# Patient Record
Sex: Male | Born: 1990 | Race: Black or African American | Hispanic: No | Marital: Single
Health system: Midwestern US, Community
[De-identification: ages and names within clinical notes are randomized; demographics above are authoritative.]

## PROBLEM LIST (undated history)

## (undated) DIAGNOSIS — F319 Bipolar disorder, unspecified: Secondary | ICD-10-CM

## (undated) DIAGNOSIS — J45909 Unspecified asthma, uncomplicated: Secondary | ICD-10-CM

## (undated) DIAGNOSIS — E119 Type 2 diabetes mellitus without complications: Secondary | ICD-10-CM

## (undated) DIAGNOSIS — R45850 Homicidal ideations: Secondary | ICD-10-CM

## (undated) HISTORY — PX: KNEE ARTHROSCOPY: SHX127

## (undated) HISTORY — PX: HAND SURGERY: SHX662

---

## 2015-05-03 ENCOUNTER — Emergency Department (HOSPITAL_COMMUNITY): Payer: Self-pay

## 2015-05-03 ENCOUNTER — Emergency Department (HOSPITAL_BASED_OUTPATIENT_CLINIC_OR_DEPARTMENT_OTHER): Payer: Self-pay

## 2015-05-03 ENCOUNTER — Encounter (HOSPITAL_COMMUNITY): Payer: Self-pay | Admitting: Emergency Medicine

## 2015-05-03 ENCOUNTER — Emergency Department (HOSPITAL_BASED_OUTPATIENT_CLINIC_OR_DEPARTMENT_OTHER)
Admission: EM | Admit: 2015-05-03 | Discharge: 2015-05-03 | Discharge status: Against Medical Advice | Payer: Self-pay | Attending: Emergency Medicine | Admitting: Emergency Medicine

## 2015-05-03 ENCOUNTER — Encounter (HOSPITAL_BASED_OUTPATIENT_CLINIC_OR_DEPARTMENT_OTHER): Payer: Self-pay | Admitting: Emergency Medicine

## 2015-05-03 DIAGNOSIS — R0602 Shortness of breath: Secondary | ICD-10-CM | POA: Insufficient documentation

## 2015-05-03 DIAGNOSIS — R05 Cough: Secondary | ICD-10-CM | POA: Insufficient documentation

## 2015-05-03 DIAGNOSIS — J45901 Unspecified asthma with (acute) exacerbation: Secondary | ICD-10-CM | POA: Insufficient documentation

## 2015-05-03 DIAGNOSIS — Z720 Tobacco use: Secondary | ICD-10-CM | POA: Insufficient documentation

## 2015-05-03 DIAGNOSIS — R079 Chest pain, unspecified: Secondary | ICD-10-CM | POA: Insufficient documentation

## 2015-05-03 DIAGNOSIS — J45909 Unspecified asthma, uncomplicated: Secondary | ICD-10-CM | POA: Insufficient documentation

## 2015-05-03 LAB — CBC
HCT: 38.6 % — ABNORMAL LOW (ref 39.0–52.0)
HEMOGLOBIN: 13.1 g/dL (ref 13.0–17.0)
MCH: 29 pg (ref 26.0–34.0)
MCHC: 33.9 g/dL (ref 30.0–36.0)
MCV: 85.4 fL (ref 78.0–100.0)
Platelets: 383 10*3/uL (ref 150–400)
RBC: 4.52 MIL/uL (ref 4.22–5.81)
RDW: 14.7 % (ref 11.5–15.5)
WBC: 5.8 10*3/uL (ref 4.0–10.5)

## 2015-05-03 LAB — I-STAT TROPONIN, ED: Troponin i, poc: 0 ng/mL (ref 0.00–0.08)

## 2015-05-03 LAB — TROPONIN I: Troponin I: <0.03 ng/mL (ref <0.031)

## 2015-05-03 NOTE — ED Notes (Signed)
Pt requesting IV out as he states he needs to leave.  Pt states, "I didn't think it would take this long and I need to get home."  Pt verbalizes understanding of the risks of leaving and the benefits of staying.

## 2015-05-03 NOTE — ED Notes (Signed)
Pt. reports central chest pain with SOB and productive cough onset last week , denies nausea or diaphoresis .

## 2015-05-03 NOTE — ED Notes (Signed)
Patient reports that he is having SOB, chest pain and hurts to cough.

## 2015-05-04 ENCOUNTER — Emergency Department (HOSPITAL_COMMUNITY)
Admission: EM | Admit: 2015-05-04 | Discharge: 2015-05-04 | Discharge status: Against Medical Advice | Payer: Self-pay | Attending: Emergency Medicine | Admitting: Emergency Medicine

## 2015-05-04 HISTORY — DX: Bipolar disorder, unspecified: F31.9

## 2015-05-04 HISTORY — DX: Unspecified asthma, uncomplicated: J45.909

## 2015-05-04 LAB — BASIC METABOLIC PANEL
Anion gap: 8 (ref 5–15)
BUN: 8 mg/dL (ref 6–20)
CALCIUM: 9.5 mg/dL (ref 8.9–10.3)
CO2: 25 mmol/L (ref 22–32)
CREATININE: 0.88 mg/dL (ref 0.61–1.24)
Chloride: 107 mmol/L (ref 101–111)
Glucose, Bld: 116 mg/dL — ABNORMAL HIGH (ref 65–99)
Potassium: 3.8 mmol/L (ref 3.5–5.1)
SODIUM: 140 mmol/L (ref 135–145)

## 2015-05-04 NOTE — ED Notes (Signed)
Patient wanting to leave.  Encouraged patient to stay to be seen.  Spoke with Dr. Mora Bellman and explained that the patient wanted to go.  He looked at the EKG and explained to him that it is similar to the EKG done yesterday at [facility].  Talked with the patient and the patient decided he was going to leave.  Talked to him explaining he needed to come back if he began feeling worse.

## 2015-05-15 ENCOUNTER — Encounter (HOSPITAL_COMMUNITY): Payer: Self-pay

## 2015-05-15 ENCOUNTER — Emergency Department (HOSPITAL_COMMUNITY)
Admission: EM | Admit: 2015-05-15 | Discharge: 2015-05-16 | Disposition: A | Discharge status: Other Acute Inpatient Hospital | Payer: Self-pay | Attending: Emergency Medicine | Admitting: Emergency Medicine

## 2015-05-15 DIAGNOSIS — J45909 Unspecified asthma, uncomplicated: Secondary | ICD-10-CM | POA: Insufficient documentation

## 2015-05-15 DIAGNOSIS — F329 Major depressive disorder, single episode, unspecified: Secondary | ICD-10-CM | POA: Insufficient documentation

## 2015-05-15 DIAGNOSIS — Z720 Tobacco use: Secondary | ICD-10-CM | POA: Insufficient documentation

## 2015-05-15 DIAGNOSIS — F3130 Bipolar disorder, current episode depressed, mild or moderate severity, unspecified: Secondary | ICD-10-CM | POA: Diagnosis present

## 2015-05-15 DIAGNOSIS — R45851 Suicidal ideations: Secondary | ICD-10-CM

## 2015-05-15 LAB — COMPREHENSIVE METABOLIC PANEL
ALT: 23 U/L (ref 17–63)
ANION GAP: 10 (ref 5–15)
AST: 22 U/L (ref 15–41)
Albumin: 4 g/dL (ref 3.5–5.0)
Alkaline Phosphatase: 73 U/L (ref 38–126)
BUN: 12 mg/dL (ref 6–20)
CHLORIDE: 108 mmol/L (ref 101–111)
CO2: 22 mmol/L (ref 22–32)
CREATININE: 0.78 mg/dL (ref 0.61–1.24)
Calcium: 9.6 mg/dL (ref 8.9–10.3)
GFR calc non Af Amer: >60 mL/min (ref >60)
Glucose, Bld: 92 mg/dL (ref 65–99)
POTASSIUM: 3.8 mmol/L (ref 3.5–5.1)
SODIUM: 140 mmol/L (ref 135–145)
Total Bilirubin: 0.6 mg/dL (ref 0.3–1.2)
Total Protein: 7.9 g/dL (ref 6.5–8.1)

## 2015-05-15 LAB — SALICYLATE LEVEL

## 2015-05-15 LAB — CBC
HCT: 39.8 % (ref 39.0–52.0)
HEMOGLOBIN: 13.3 g/dL (ref 13.0–17.0)
MCH: 28.7 pg (ref 26.0–34.0)
MCHC: 33.4 g/dL (ref 30.0–36.0)
MCV: 86 fL (ref 78.0–100.0)
PLATELETS: 391 10*3/uL (ref 150–400)
RBC: 4.63 MIL/uL (ref 4.22–5.81)
RDW: 15.1 % (ref 11.5–15.5)
WBC: 5.4 10*3/uL (ref 4.0–10.5)

## 2015-05-15 LAB — RAPID URINE DRUG SCREEN, HOSP PERFORMED
AMPHETAMINES: NOT DETECTED
Barbiturates: NOT DETECTED
Benzodiazepines: NOT DETECTED
COCAINE: NOT DETECTED
OPIATES: NOT DETECTED
TETRAHYDROCANNABINOL: NOT DETECTED

## 2015-05-15 LAB — ETHANOL

## 2015-05-15 LAB — ACETAMINOPHEN LEVEL

## 2015-05-15 MED ORDER — ONDANSETRON HCL 4 MG PO TABS: 4.0000 mg | ORAL_TABLET | Freq: Three times a day (TID) | ORAL | Status: DC | PRN

## 2015-05-15 MED ORDER — IBUPROFEN 200 MG PO TABS: 600.0000 mg | ORAL_TABLET | Freq: Three times a day (TID) | ORAL | Status: DC | PRN

## 2015-05-15 MED ORDER — ALUM & MAG HYDROXIDE-SIMETH 200-200-20 MG/5ML PO SUSP: 30.0000 mL | ORAL | Status: DC | PRN

## 2015-05-15 MED ORDER — LORAZEPAM 1 MG PO TABS: 1.0000 mg | ORAL_TABLET | Freq: Three times a day (TID) | ORAL | Status: DC | PRN

## 2015-05-15 MED ORDER — ACETAMINOPHEN 325 MG PO TABS: 650.0000 mg | ORAL_TABLET | ORAL | Status: DC | PRN

## 2015-05-15 MED ORDER — NICOTINE 21 MG/24HR TD PT24
21.0000 mg | MEDICATED_PATCH | Freq: Every day | TRANSDERMAL | Status: DC
  Filled 2015-05-15: qty 1

## 2015-05-15 NOTE — ED Provider Notes (Signed)
CSN: [id number]     Arrival date & time 05/15/15  1216 History  This chart was scribed for non-physician practitioner, Everlene Farrier, PA-C working with Pricilla Loveless, MD by Placido Sou, ED scribe. This patient was seen in room WTR3/WLPT3 and the patient's care was started at 1:51 PM.   Chief Complaint  Patient presents with  . Suicidal   The history is provided by the patient. No language interpreter was used.    HPI Comments: Blake Petersen is a 24 y.o. male, with a hx of bipolar disorder, ADHD and depression, who presents to the Emergency Department with GPD complaining of SI with onset earlier today. Pt notes a hx of depression and has been off of his medications for 1 month. He notes some current SI and says that he would "run out in traffic" as well as HI noting that he wants to "rip his bosses head off" and some mild sleep disturbance further noting sleeping about 3 hours per night. Pt notes a hx of SI with multiple attempts to cut his wrists. He notes a hx of mental health issues and further notes he was last admitted for these symptoms 2 years ago. He notes smoking 1 PPD, confirms intermittent ETOH use with his most recent occurrence 1 week ago and further notes 1x ecstasy, acid and ingesting mushrooms 1 week ago. Pt denies hallucinations, fevers, chills, lightheadedness, CP, SOB, n/v/d and dysuria.   Past Medical History  Diagnosis Date  . Bipolar 1 disorder   . Asthma    Past Surgical History  Procedure Laterality Date  . Hand surgery    . Knee arthroscopy     History reviewed. No pertinent family history. Social History  Substance Use Topics  . Smoking status: Current Every Day Smoker  . Smokeless tobacco: None  . Alcohol Use: Yes     Comment: occ    Review of Systems  Constitutional: Negative for fever and chills.  Respiratory: Negative for shortness of breath.   Cardiovascular: Negative for chest pain.  Gastrointestinal: Negative for nausea, vomiting, abdominal pain  and diarrhea.  Genitourinary: Negative for dysuria.  Skin: Negative for rash.  Neurological: Negative for light-headedness.  Psychiatric/Behavioral: Positive for suicidal ideas, sleep disturbance and dysphoric mood. Negative for hallucinations, confusion and self-injury.    Allergies  Iodine and Shellfish allergy  Home Medications   Prior to Admission medications   Not on File   BP 122/73 mmHg  Pulse 71  Temp(Src) 97.9 F (36.6 C) (Oral)  Resp 17  SpO2 99% Physical Exam  Constitutional: He is oriented to person, place, and time. He appears well-developed and well-nourished. No distress.  Nontoxic appearing.   HENT:  Head: Normocephalic and atraumatic.  Eyes: Conjunctivae are normal. Pupils are equal, round, and reactive to light. Right eye exhibits no discharge. Left eye exhibits no discharge.  Neck: Neck supple.  Cardiovascular: Normal rate, regular rhythm, normal heart sounds and intact distal pulses.   Pulmonary/Chest: Effort normal and breath sounds normal. No respiratory distress.  Abdominal: Soft. There is no tenderness.  Lymphadenopathy:    He has no cervical adenopathy.  Neurological: He is alert and oriented to person, place, and time. Coordination normal.  Skin: Skin is warm and dry. No rash noted. He is not diaphoretic. No erythema. No pallor.  Psychiatric: His speech is normal and behavior is normal. He is not hyperactive and not actively hallucinating. Thought content is not paranoid. He exhibits a depressed mood. He expresses homicidal and suicidal ideation. He  expresses suicidal plans. He expresses no homicidal plans.  Patient endorses suicidal ideations with a plan to run into traffic. He also reports feeling homicidal ideations earlier today. Patient makes good eye contact. He appears depressed. His speech is clear and coherent. He denies visual or auditory hallucinations.  Nursing note and vitals reviewed.  ED Course  Procedures  DIAGNOSTIC STUDIES: Oxygen  Saturation is 97% on RA, normal by my interpretation.    COORDINATION OF CARE: 1:58 PM Discussed treatment plan with pt at bedside and pt agreed to plan.  Labs Review Labs Reviewed  ACETAMINOPHEN LEVEL - Abnormal; Notable for the following:    Acetaminophen (Tylenol), Serum <10 (*)    All other components within normal limits  COMPREHENSIVE METABOLIC PANEL  ETHANOL  SALICYLATE LEVEL  CBC  URINE RAPID DRUG SCREEN, HOSP PERFORMED    Imaging Review No results found. I have personally reviewed and evaluated these images and lab results as part of my medical decision-making.   EKG Interpretation None      Filed Vitals:   05/15/15 1226 05/15/15 1419  BP: 133/86 122/73  Pulse: 87 71  Temp: 98.3 F (36.8 C) 97.9 F (36.6 C)  TempSrc: Oral Oral  Resp: 18 17  SpO2: 97% 99%     MDM   Meds given in ED:  Medications  alum & mag hydroxide-simeth (MAALOX/MYLANTA) 200-200-20 MG/5ML suspension 30 mL (not administered)  ondansetron (ZOFRAN) tablet 4 mg (not administered)  nicotine (NICODERM CQ - dosed in mg/24 hours) patch 21 mg (not administered)  ibuprofen (ADVIL,MOTRIN) tablet 600 mg (not administered)  acetaminophen (TYLENOL) tablet 650 mg (not administered)  LORazepam (ATIVAN) tablet 1 mg (not administered)    New Prescriptions   No medications on file    Final diagnoses:  Suicidal ideation   This is a 24 year old male who presents to the emergency department complaining of suicidal ideations with a plan to jump into traffic today. Patient reports she's been off of his medications for over a month. The patient is voluntary. He denies fevers or recent illness. He denies visual or auditory hallucinations. Patient appears depressed and makes good eye contact. He denies any attempts of suicide today. CMP and CBC are within normal limits. Alcohol, salicylate and acetaminophen levels are negative. Patient's urine drug screen is pending. Patient is medically clear for behavioral  health admission. TTS feels the patient meets inpatient criteria and they're awaiting bed assignment. Psych hold orders placed.   I personally performed the services described in this documentation, which was scribed in my presence. The recorded information has been reviewed and is accurate.    Everlene Farrier, PA-C 05/15/15 1601  Pricilla Loveless, MD 05/16/15 [phone]

## 2015-05-15 NOTE — ED Notes (Signed)
Patient ambulated onto the unit without difficulty.  Has been lying on his bed since arriving on the unit.

## 2015-05-15 NOTE — ED Notes (Signed)
Per Pt, here with GPD voluntarily for Suicidal Ideation.  Pt states it has been going on for a while.  Pt states he has been off of his meds x 2 months.  Pt states he would run into traffic.  Denies attempt for today.

## 2015-05-15 NOTE — BH Assessment (Signed)
Per Blake Petersen - patient meets criteria for inpatient hospitalization.  TTS will seek placement.

## 2015-05-15 NOTE — Progress Notes (Signed)
EDCM spoke to patient at bedside. Patient confirms he does not have a pcp or insurance living in [place].  [facility] provided patient contact information to [facility], informed patient of services there.  EDCM also provided patient with list of pcps who accept self pay patients, list of discount pharmacies and websites needymeds.org and GoodRX.com for medication assistance, phone number to inquire about the orange card, phone number to inquire about Mediciad, phone number to inquire about the Affordable Care Act, financial resources in the community such as local churches, salvation army, urban ministries, and dental assistance for uninsured patients.  Patient thankful for resources.  No further EDCM needs at this time.

## 2015-05-15 NOTE — ED Notes (Signed)
Bed: [facility]PT3 Expected date:  Expected time:  Means of arrival:  Comments: Med Clearance GPD
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
## 2015-05-15 NOTE — ED Notes (Signed)
Bed: WBH40 Expected date:  Expected time:  Means of arrival:  Comments: Triage 3 

## 2015-05-16 ENCOUNTER — Inpatient Hospital Stay (HOSPITAL_COMMUNITY)
Admission: AD | Admit: 2015-05-16 | Discharge: 2015-05-24 | DRG: 885 | Disposition: A | Discharge status: Home / Self Care | Payer: Federal, State, Local not specified - Other | Source: Intra-hospital | Attending: Psychiatry | Admitting: Psychiatry

## 2015-05-16 ENCOUNTER — Encounter (HOSPITAL_COMMUNITY): Payer: Self-pay

## 2015-05-16 DIAGNOSIS — R45851 Suicidal ideations: Secondary | ICD-10-CM | POA: Diagnosis present

## 2015-05-16 DIAGNOSIS — F3130 Bipolar disorder, current episode depressed, mild or moderate severity, unspecified: Secondary | ICD-10-CM | POA: Diagnosis present

## 2015-05-16 DIAGNOSIS — E781 Pure hyperglyceridemia: Secondary | ICD-10-CM | POA: Diagnosis present

## 2015-05-16 DIAGNOSIS — F319 Bipolar disorder, unspecified: Principal | ICD-10-CM | POA: Diagnosis present

## 2015-05-16 DIAGNOSIS — Z23 Encounter for immunization: Secondary | ICD-10-CM | POA: Diagnosis not present

## 2015-05-16 DIAGNOSIS — F17210 Nicotine dependence, cigarettes, uncomplicated: Secondary | ICD-10-CM | POA: Diagnosis present

## 2015-05-16 DIAGNOSIS — G4700 Insomnia, unspecified: Secondary | ICD-10-CM | POA: Diagnosis present

## 2015-05-16 DIAGNOSIS — F314 Bipolar disorder, current episode depressed, severe, without psychotic features: Secondary | ICD-10-CM

## 2015-05-16 MED ORDER — ALUM & MAG HYDROXIDE-SIMETH 200-200-20 MG/5ML PO SUSP: 30.0000 mL | ORAL | Status: DC | PRN

## 2015-05-16 MED ORDER — FLUOXETINE HCL 10 MG PO CAPS
10.0000 mg | ORAL_CAPSULE | Freq: Every day | ORAL | Status: DC
  Administered 2015-05-17 – 2015-05-18 (×2): 10 mg via ORAL
  Filled 2015-05-16 (×5): qty 1

## 2015-05-16 MED ORDER — NICOTINE 21 MG/24HR TD PT24
21.0000 mg | MEDICATED_PATCH | Freq: Every day | TRANSDERMAL | Status: DC
  Administered 2015-05-17 – 2015-05-24 (×9): 21 mg via TRANSDERMAL
  Filled 2015-05-16 (×15): qty 1

## 2015-05-16 MED ORDER — IBUPROFEN 600 MG PO TABS: 600.0000 mg | ORAL_TABLET | Freq: Three times a day (TID) | ORAL | Status: DC | PRN

## 2015-05-16 MED ORDER — FLUOXETINE HCL 10 MG PO CAPS
10.0000 mg | ORAL_CAPSULE | Freq: Every day | ORAL | Status: DC
  Administered 2015-05-16: 10 mg via ORAL
  Filled 2015-05-16: qty 1

## 2015-05-16 MED ORDER — CARBAMAZEPINE 200 MG PO TABS
200.0000 mg | ORAL_TABLET | Freq: Two times a day (BID) | ORAL | Status: DC
  Administered 2015-05-16 – 2015-05-18 (×4): 200 mg via ORAL
  Filled 2015-05-16 (×10): qty 1

## 2015-05-16 MED ORDER — ACETAMINOPHEN 325 MG PO TABS: 650.0000 mg | ORAL_TABLET | ORAL | Status: DC | PRN

## 2015-05-16 MED ORDER — CARBAMAZEPINE 200 MG PO TABS
200.0000 mg | ORAL_TABLET | Freq: Two times a day (BID) | ORAL | Status: DC
  Filled 2015-05-16 (×2): qty 1

## 2015-05-16 NOTE — B[facility] Counselor (Signed)
Referral packet sent to [facility], [place], [place], and [place] on 05/16/15 at 1:45 a.m.  Shean K. Harris, MS, NCC, LCAS-A  Counselor 05/16/2015 2:20 AM

## 2015-05-16 NOTE — Consult Note (Signed)
[facility] Consult   Reason for Consult:  Bipolar disorder currently  Depressed Referring Physician:  EDP Patient Identification: Blake Petersen MRN:  [id number] Principal Diagnosis: Bipolar affective disorder, current episode depressed Diagnosis:   Patient Active Problem List   Diagnosis Date Noted  . Bipolar affective disorder, current episode depressed [F31.30] 05/16/2015    Priority: High    Total Time spent with patient: 1 hour  Subjective:   Blake Petersen is a 24 y.o. male patient admitted with [facility] .  HPI:  AA male, 24 years old was evaluated for suicide ideation brought in by [employer].  Patient reports that he has been off his Bipolar disorder medication for 3 months.  Patient also stated that he started feeling more depressed and wanted to kill himself when he stopped taking medications.  Patient relocated from [place] and stated that his Psychiatrist  is in [place].   Patient reports poor sleep 3-4 hours at night.  He also has been homeless  For few days.  He was staying at a Motel but had to leave because he could not afford to pay for the hotel.  He is still endorsing suicide with a plan to jump in front of traffic but denies HI/AVH.   He admitted to previous suicide attempt by cutting his wrist.   He has been accepted for admission and has a bed assigned.  HPI Elements:   Location:  Schizoaffective disorder, depressed. Quality:  severe. Severity:  severe. Timing:  acute. Duration:  Chronic mental illness.. Context:  Brought in for suicide ideation..  Past Medical History:  Past Medical History  Diagnosis Date  . Bipolar 1 disorder   . Asthma     Past Surgical History  Procedure Laterality Date  . Hand surgery    . Knee arthroscopy     Family History: History reviewed. No pertinent family history. Social History:  History  Alcohol Use  . Yes    Comment: occ     History  Drug Use No    Social History   Social History  . Marital  Status: Single    Spouse Name: N/A  . Number of Children: N/A  . Years of Education: N/A   Social History Main Topics  . Smoking status: Current Every Day Smoker  . Smokeless tobacco: None  . Alcohol Use: Yes     Comment: occ  . Drug Use: No  . Sexual Activity: Not Asked   Other Topics Concern  . None   Social History Narrative   Additional Social History:    History of alcohol / drug use?: No history of alcohol / drug abuse                     Allergies:   Allergies  Allergen Reactions  . Iodine Anaphylaxis  . Shellfish Allergy Anaphylaxis    Labs:  Results for orders placed or performed during the hospital encounter of 05/15/15 (from the past 48 hour(s))  Ethanol (ETOH)     Status: None   Collection Time: 05/15/15 12:53 PM  Result Value Ref Range   Alcohol, Ethyl (B) <5 <5 mg/dL    Comment:        LOWEST DETECTABLE LIMIT FOR SERUM ALCOHOL IS 5 mg/dL FOR MEDICAL PURPOSES ONLY   Salicylate level     Status: None   Collection Time: 05/15/15 12:53 PM  Result Value Ref Range   Salicylate Lvl <1.7 2.8 - 30.0 mg/dL  Acetaminophen level  Status: Abnormal   Collection Time: 05/15/15 12:53 PM  Result Value Ref Range   Acetaminophen (Tylenol), Serum <10 (L) 10 - 30 ug/mL    Comment:        THERAPEUTIC CONCENTRATIONS VARY SIGNIFICANTLY. A RANGE OF 10-30 ug/mL MAY BE AN EFFECTIVE CONCENTRATION FOR MANY PATIENTS. HOWEVER, SOME ARE BEST TREATED AT CONCENTRATIONS OUTSIDE THIS RANGE. ACETAMINOPHEN CONCENTRATIONS >150 ug/mL AT 4 HOURS AFTER INGESTION AND >50 ug/mL AT 12 HOURS AFTER INGESTION ARE OFTEN ASSOCIATED WITH TOXIC REACTIONS.   Comprehensive metabolic panel     Status: None   Collection Time: 05/15/15 12:58 PM  Result Value Ref Range   Sodium 140 135 - 145 mmol/L   Potassium 3.8 3.5 - 5.1 mmol/L   Chloride 108 101 - 111 mmol/L   CO2 22 22 - 32 mmol/L   Glucose, Bld 92 65 - 99 mg/dL   BUN 12 6 - 20 mg/dL   Creatinine, Ser 0.78 0.61 - 1.24  mg/dL   Calcium 9.6 8.9 - 10.3 mg/dL   Total Protein 7.9 6.5 - 8.1 g/dL   Albumin 4.0 3.5 - 5.0 g/dL   AST 22 15 - 41 U/L   ALT 23 17 - 63 U/L   Alkaline Phosphatase 73 38 - 126 U/L   Total Bilirubin 0.6 0.3 - 1.2 mg/dL   GFR calc non Af Amer >60 >60 mL/min   GFR calc Af Amer >60 >60 mL/min    Comment: (NOTE) The eGFR has been calculated using the CKD EPI equation. This calculation has not been validated in all clinical situations. eGFR's persistently <60 mL/min signify possible Chronic Kidney Disease.    Anion gap 10 5 - 15  CBC     Status: None   Collection Time: 05/15/15 12:58 PM  Result Value Ref Range   WBC 5.4 4.0 - 10.5 K/uL   RBC 4.63 4.22 - 5.81 MIL/uL   Hemoglobin 13.3 13.0 - 17.0 g/dL   HCT 39.8 39.0 - 52.0 %   MCV 86.0 78.0 - 100.0 fL   MCH 28.7 26.0 - 34.0 pg   MCHC 33.4 30.0 - 36.0 g/dL   RDW 15.1 11.5 - 15.5 %   Platelets 391 150 - 400 K/uL  Urine rapid drug screen (hosp performed) (Not at [facility])     Status: None   Collection Time: 05/15/15  6:49 PM  Result Value Ref Range   Opiates NONE DETECTED NONE DETECTED   Cocaine NONE DETECTED NONE DETECTED   Benzodiazepines NONE DETECTED NONE DETECTED   Amphetamines NONE DETECTED NONE DETECTED   Tetrahydrocannabinol NONE DETECTED NONE DETECTED   Barbiturates NONE DETECTED NONE DETECTED    Comment:        DRUG SCREEN FOR MEDICAL PURPOSES ONLY.  IF CONFIRMATION IS NEEDED FOR ANY PURPOSE, NOTIFY LAB WITHIN 5 DAYS.        LOWEST DETECTABLE LIMITS FOR URINE DRUG SCREEN Drug Class       Cutoff (ng/mL) Amphetamine      1000 Barbiturate      200 Benzodiazepine   592 Tricyclics       924 Opiates          300 Cocaine          300 THC              50     Vitals: Blood pressure 118/62, pulse 73, temperature 98.2 F (36.8 C), temperature source Oral, resp. rate 18, SpO2 98 %.  Risk to Self: Suicidal Ideation: Yes-Currently  Present Suicidal Intent: Yes-Currently Present Is patient at risk for suicide?:  Yes Suicidal Plan?: Yes-Currently Present Specify Current Suicidal Plan: Run into traffic or cut his wrist Access to Means: Yes Specify Access to Suicidal Means: Running into traffic or a piece of glass. What has been your use of drugs/alcohol within the last 12 months?: None Reported How many times?: 1 Other Self Harm Risks: None Reported Triggers for Past Attempts: Unpredictable Intentional Self Injurious Behavior: Cutting Comment - Self Injurious Behavior: wrist Risk to Others: Homicidal Ideation: No Thoughts of Harm to Others: No Current Homicidal Intent: No Current Homicidal Plan: No Access to Homicidal Means: No Identified Victim: None Reported History of harm to others?: No Assessment of Violence: None Noted Violent Behavior Description: None Reported Does patient have access to weapons?: No Criminal Charges Pending?: No Does patient have a court date: No Prior Inpatient Therapy: Prior Inpatient Therapy: Yes Prior Therapy Dates: 2014 Prior Therapy Facilty/Provider(s): [facility] in [place] to remember the name. Reason for Treatment: SI Prior Outpatient Therapy: Prior Outpatient Therapy: Yes Prior Therapy Dates: July 2016 Prior Therapy Facilty/Provider(s): [facility] Reason for Treatment: Medication Management and Outpatient Therapy  Does patient have an ACCT team?: No Does patient have Intensive In-House Services?  : No Does patient have [facility] services? : No Does patient have P4CC services?: No  Current Facility-Administered Medications  Medication Dose Route Frequency Provider Last Rate Last Dose  . acetaminophen (TYLENOL) tablet 650 mg  650 mg Oral Q4H PRN Waynetta Pean, PA-C      . alum & mag hydroxide-simeth (MAALOX/MYLANTA) 200-200-20 MG/5ML suspension 30 mL  30 mL Oral PRN Waynetta Pean, PA-C      . carbamazepine (TEGRETOL) tablet 200 mg  200 mg Oral BID PC Mojeed Akintayo      . FLUoxetine (PROZAC) capsule 10 mg  10 mg Oral Daily  Mojeed Akintayo   10 mg at 05/16/15 1137  . ibuprofen (ADVIL,MOTRIN) tablet 600 mg  600 mg Oral Q8H PRN Waynetta Pean, PA-C      . LORazepam (ATIVAN) tablet 1 mg  1 mg Oral Q8H PRN Waynetta Pean, PA-C      . nicotine (NICODERM CQ - dosed in mg/24 hours) patch 21 mg  21 mg Transdermal Daily Waynetta Pean, PA-C   21 mg at 05/15/15 1853  . ondansetron (ZOFRAN) tablet 4 mg  4 mg Oral Q8H PRN Waynetta Pean, PA-C       No current outpatient prescriptions on file.    Musculoskeletal: Strength & Muscle Tone: within normal limits Gait & Station: normal Patient leans: N/A  Psychiatric Specialty Exam: Physical Exam  Review of Systems  Constitutional: Negative.   HENT: Negative.   Eyes: Negative.   Respiratory: Negative.   Cardiovascular: Negative.   Gastrointestinal: Negative.   Genitourinary: Negative.   Musculoskeletal: Negative.   Skin: Negative.   Neurological: Negative.   Endo/Heme/Allergies: Negative.     Blood pressure 118/62, pulse 73, temperature 98.2 F (36.8 C), temperature source Oral, resp. rate 18, SpO2 98 %.There is no weight on file to calculate BMI.  General Appearance: Casual  Eye Contact::  Poor  Speech:  Pressured  Volume:  Normal  Mood:  Angry and Depressed  Affect:  Congruent, Depressed and Flat  Thought Process:  Coherent, Goal Directed and Intact  Orientation:  Full (Time, Place, and Person)  Thought Content:  WDL  Suicidal Thoughts:  Yes.  with intent/plan  Homicidal Thoughts:  No  Memory:  Immediate;  Good Recent;   Good Remote;   Good  Judgement:  Poor  Insight:  Shallow  Psychomotor Activity:  Psychomotor Retardation  Concentration:  Fair  Recall:  NA  Fund of Knowledge:Poor  Language: Fair  Akathisia:  NA  Handed:  Right  AIMS (if indicated):     Assets:  Desire for Improvement Housing Transportation  ADL's:  Intact  Cognition: WNL  Sleep:      Medical Decision Making: Review of Psycho-Social Stressors (1), Established Problem,  Worsening (2), Review of Medication Regimen & Side Effects (2) and Review of New Medication or Change in Dosage (2)  Treatment Plan Summary: Daily contact with patient to assess and evaluate symptoms and progress in treatment and Medication management  Plan:  Resume all home medications. Disposition: Admitted and has a bed assigned  Delfin Gant  PMHNP-BC 05/16/2015 4:13 PM Patient seen face-to-face for psychiatric evaluation, chart reviewed and case discussed with the physician extender and developed treatment plan. Reviewed the information documented and agree with the treatment plan. Corena Pilgrim, MD

## 2015-05-16 NOTE — Progress Notes (Signed)
Patient is a 24 years old African American male admitted to the unit for suicide ideation.  Patient has history of Bipolar disorder.  Reports that he has been off his medications for three months.  Patient affect is flat and mood is depressed.  Patient was irritable and upset during assessment and was unwilling to participate.  Endorses passive suicide.  Contracts for safety.  Would not divulge suicide plan.  Patient introduced to unit, staff, room and roommate.  Maintained on routine safety checks.  Patient safe on the unit.

## 2015-05-16 NOTE — ED Notes (Signed)
Patient reports SI with a plan to run into traffic or to cut his wrist but denies HI and AVH at this time. Plan of care discussed with patient. Patient voices no complaints or concerns at this time. Encouragement and support provided and safety maintain. Q 15 min safety checks remain in place.

## 2015-05-16 NOTE — Progress Notes (Signed)
Adult Psychoeducational Group Note  Date:  05/16/2015 Time:  10:08 PM  Group Topic/Focus:  Wrap-Up Group:   The focus of this group is to help patients review their daily goal of treatment and discuss progress on daily workbooks.  Participation Level:  Did Not Attend  Participation Quality:  Did not attend  Affect:  Did not attend  Cognitive:  Did not attend  Insight: None  Engagement in Group:  Did not attend  Modes of Intervention:  Did not attend  Additional Comments:  Patient did not attend wrap up group tonight.   Marrisa Kimber L Jandiel Magallanes 05/16/2015, 10:08 PM

## 2015-05-16 NOTE — Tx Team (Signed)
Initial Interdisciplinary Treatment Plan   PATIENT STRESSORS: Financial difficulties Medication change or noncompliance Occupational concerns   PATIENT STRENGTHS: Ability for insight Communication skills Motivation for treatment/growth Physical Health Work skills   PROBLEM LIST: Problem List/Patient Goals Date to be addressed Date deferred Reason deferred Estimated date of resolution  "I want to get back on my meds." 05/16/2015           "I want to find another living arrangement.' 05/16/2015           Suicide Ideation. 05/16/2015                              DISCHARGE CRITERIA:  Ability to meet basic life and health needs Improved stabilization in mood, thinking, and/or behavior Motivation to continue treatment in a less acute level of care Verbal commitment to aftercare and medication compliance  PRELIMINARY DISCHARGE PLAN: Attend aftercare/continuing care group Placement in alternative living arrangements Return to previous work or school arrangements  PATIENT/FAMIILY INVOLVEMENT: This treatment plan has been presented to and reviewed with the patient, Management consultant, and/or family member.  The patient and family have been given the opportunity to ask questions and make suggestions.  Mickie Bail 05/16/2015, 6:42 PM

## 2015-05-16 NOTE — BH Assessment (Signed)
[facility] Assessment Progress Note  Per Thedore Mins, MD, this pt requires psychiatric hospitalization at this time.  Rosey Bath, RN, [facility] has assigned pt to [facility] Rm [id number] with a caveat that pt is not to be sent until 16:30.  Pt has signed Voluntary Admission and Consent for Treatment, as well as Consent to Release Information to his former providers in [place], and signed forms have been faxed to [facility].  Pt's nurse, Rayfield Citizen, has been notified, and agrees to send original paperwork along with pt via Juel Burrow, and to call report to [phone].  Doylene Canning, MA Triage Specialist [phone]

## 2015-05-16 NOTE — Progress Notes (Signed)
Discharge note:  Patient discharged from [facility].  Patient is being transferred to [facility] 400 hall.  Belongings were sent with [place] driver.  Patient left without incident.

## 2015-05-17 DIAGNOSIS — F3130 Bipolar disorder, current episode depressed, mild or moderate severity, unspecified: Secondary | ICD-10-CM

## 2015-05-17 MED ORDER — TRAZODONE HCL 50 MG PO TABS
50.0000 mg | ORAL_TABLET | Freq: Every evening | ORAL | Status: DC | PRN
  Filled 2015-05-17 (×6): qty 1

## 2015-05-17 MED ORDER — LISINOPRIL 10 MG PO TABS
10.0000 mg | ORAL_TABLET | Freq: Once | ORAL | Status: DC
Start: 2015-05-17 — End: 2015-05-24
  Filled 2015-05-17: qty 2
  Filled 2015-05-17: qty 1

## 2015-05-17 MED ORDER — LISINOPRIL 20 MG PO TABS: 20.0000 mg | ORAL_TABLET | Freq: Once | ORAL | Status: DC

## 2015-05-17 NOTE — BHH Suicide Risk Assessment (Addendum)
[facility] Admission Suicide Risk Assessment   Nursing information obtained from:   chart, patient  Demographic factors:   24 year old male, living in a hotel, works as a Theatre manager, has three children, living with their mother in [place] Current Mental Status:   see below Loss Factors:   financial difficulties, stressful job, limited support network Historical Factors:   history of depression, history of substance abuse  Risk Reduction Factors:   resilience, sense of responsibility to family  Total Time spent with patient: 45 minutes Principal Problem:  Bipolar Disorder , Depressed  Diagnosis:   Patient Active Problem List   Diagnosis Date Noted  . Bipolar affective disorder, current episode depressed [F31.30] 05/16/2015  . Bipolar disorder with depression [F31.30] 05/16/2015     Continued Clinical Symptoms:    The "Alcohol Use Disorders Identification Test", Guidelines for Use in Primary Care, Second Edition.  World Science writer [facility]). Score between 0-7:  no or low risk or alcohol related problems. Score between 8-15:  moderate risk of alcohol related problems. Score between 16-19:  high risk of alcohol related problems. Score 20 or above:  warrants further diagnostic evaluation for alcohol dependence and treatment.   CLINICAL FACTORS:  Patient is a 24 year old male, originally from [place], but currently in [place] working as a travelling Manufacturing engineer. States he has history of mood disorder, and has been told he has bipolar disorder. He reports he has been off his psychiatric medications since he left [place] several weeks ago. He is unsure of what medications he was on, but remembers Prozac. Off his medications he has felt irritable, easily angered, upset . He presented to ED with suicidal ideations.  Describes occasional drug abuse ( cannabis, mushrooms) but not recently and admission UDS was negative . Denies alcohol abuse . Presents dysphoric , irritable, depressed, but not  suicidal or psychotic. Dx- Bipolar Disorder, Depressed  Plan - inpatient Admission, Tegretol 200 mgrs BID for management of Mood Disorder, Prozac 10 mgrs QDAY for management of Depression.      Musculoskeletal: Strength & Muscle Tone: within normal limits Gait & Station: gait not examined  Patient leans: N/A  Psychiatric Specialty Exam: Physical Exam  ROS- denies chest pain, denies shortness of breath, denies vomiting.  Blood pressure 135/85, pulse 83, temperature 98.1 F (36.7 C), temperature source Oral, resp. rate 18, height 5' 8.5" (1.74 m), weight 273 lb (123.832 kg), SpO2 100 %.Body mass index is 40.9 kg/(m^2).  General Appearance: Fairly Groomed  Patent attorney::  Good  Speech:  Normal Rate  Volume:  Normal  Mood:  Dysphoric- depressed, irritable   Affect:  Congruent and mildly irritable   Thought Process:  Goal Directed  Orientation:  Full (Time, Place, and Person)  Thought Content:  denies hallucinations, no delusions, ruminative about stressors  Suicidal Thoughts:  No at this time denies any active SI and contracts for safety on the unit   Homicidal Thoughts:  No  Memory:  recent and remote grossly intact   Judgement:  Fair  Insight:  Fair  Psychomotor Activity:  Decreased  Concentration:  Good  Recall:  Good  Fund of Knowledge:Good  Language: Good  Akathisia:  Negative  Handed:  Right  AIMS (if indicated):     Assets:  Desire for Improvement Physical Health Resilience  Sleep:     Cognition: WNL  ADL's:   Fair      COGNITIVE FEATURES THAT CONTRIBUTE TO RISK:  Closed-mindedness and Loss of executive function  SUICIDE RISK:   Moderate:  Frequent suicidal ideation with limited intensity, and duration, some specificity in terms of plans, no associated intent, good self-control, limited dysphoria/symptomatology, some risk factors present, and identifiable protective factors, including available and accessible social support.  PLAN OF CARE: Patient will be  admitted to inpatient psychiatric unit for stabilization and safety. Will provide and encourage milieu participation. Provide medication management and maked adjustments as needed.  Will follow daily.    Medical Decision Making:  Review of Psycho-Social Stressors (1), Review or order clinical lab tests (1), Established Problem, Worsening (2) and Review of Medication Regimen & Side Effects (2)  I certify that inpatient services furnished can reasonably be expected to improve the patient's condition.   Nehemiah Massed 05/17/2015, 6:56 PM

## 2015-05-17 NOTE — Progress Notes (Signed)
D: Patient has been isolative to room.  He has minimal contact with staff and peers.  His hygiene is extremely poor.  He rang the call bell to ask staff why it was hot in his room.  He remains passively suicidal without a specific plan.  He denies AVH/HI. A: Continue to monitor medication management and [place] orders.  Encourage patient to attend group and participate his treatment.   R: Patient is not vested in his treatment and is not attending groups.

## 2015-05-17 NOTE — Progress Notes (Signed)
Adult Psychoeducational Group Note  Date:  05/17/2015 Time:  11:14 PM  Group Topic/Focus:  Wrap-Up Group:   The focus of this group is to help patients review their daily goal of treatment and discuss progress on daily workbooks.  Participation Level:  Did Not Attend  Participation Quality:  Did not attend  Affect:  Did not attend  Cognitive:  Did not attend  Insight: None  Engagement in Group:  Did not attend  Modes of Intervention:  Did not attend  Additional Comments:  Patient did not attend wrap up group tonight.   Taffney L Deas 05/17/2015, 11:14 PM
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
## 2015-05-17 NOTE — Tx Team (Signed)
Interdisciplinary Treatment Plan Update (Adult)  Date:  05/17/2015   Time Reviewed:  2:08 PM   Progress in Treatment: Attending groups: No Participating in groups:  No Taking medication as prescribed:  Yes. Tolerating medication:  Yes. Family/Significant othe contact made:  No Patient understands diagnosis:  Yes  As evidenced by seeking help with depression Discussing patient identified problems/goals with staff:  Yes, see initial care plan. Medical problems stabilized or resolved:  Yes. Denies suicidal/homicidal ideation: Yes. Issues/concerns per patient self-inventory:  No. Other:  New problem(s) identified:  Discharge Plan or Barriers:  Return home, follow up outpt  Reason for Continuation of Hospitalization: Anxiety Depression Medication stabilization  Comments:  Per Pt, here with [employer] voluntarily for Suicidal Ideation. Pt states it has been going on for a while. Pt states he has been off of his meds x 2 months. Pt states he would run into traffic.  Prozac, Tegretol trail  Estimated length of stay: 3-4 days  New goal(s):  Review of initial/current patient goals per problem list:   Review of initial/current patient goals per problem list:  1. Goal(s): Patient will participate in aftercare plan   Met: Yes   Target date: 3-5 days post admission date   As evidenced by: Patient will participate within aftercare plan AEB aftercare provider and housing plan at discharge being identified.  05/17/15:  Return home, follow up outpt   2. Goal (s): Patient will exhibit decreased depressive symptoms and suicidal ideations.   Met: No   Target date: 3-5 days post admission date   As evidenced by: Patient will utilize self rating of depression at 3 or below and demonstrate decreased signs of depression or be deemed stable for discharge by [place]. 05/17/15  Rates depression at a 7 today.    3. Goal(s): Patient will demonstrate decreased signs and symptoms of  anxiety.   Met: No   Target date: 3-5 days post admission date   As evidenced by: Patient will utilize self rating of anxiety at 3 or below and demonstrated decreased signs of anxiety, or be deemed stable for discharge by [place] 05/17/15  Rates anxiety at a 7 today     Attendees: Patient:  05/17/2015 2:08 PM   Family:   05/17/2015 2:08 PM   Physician:  Carlton Adam, [place] 05/17/2015 2:08 PM   Nursing:   Darrol Angel, RN 05/17/2015 2:08 PM   CSW:    Roque Lias, [place]   05/17/2015 2:08 PM   Other:  05/17/2015 2:08 PM   Other:   05/17/2015 2:08 PM   Other:  Lars Pinks, Nurse CM 05/17/2015 2:08 PM   Other:  Lucinda Dell, [place] Sessions TCT 05/17/2015 2:08 PM   Other:  Norberto Sorenson, [place]  05/17/2015 2:08 PM   Other:  05/17/2015 2:08 PM   Other:  05/17/2015 2:08 PM   Other:  05/17/2015 2:08 PM   Other:  05/17/2015 2:08 PM   Other:  05/17/2015 2:08 PM   Other:   05/17/2015 2:08 PM    Scribe for Treatment Team:   Trish Mage, 05/17/2015 2:08 PM

## 2015-05-17 NOTE — Progress Notes (Signed)
Newly admitted Pt in bed resting. Pt does not look to be in any distress at this time. Pt refused talk to nurse; he states, "I don't feel like talking to anyone right now, I just want to sleep." Pt however was able to say "NO" when asked about SI/HI. 15-minute safety checks continue.

## 2015-05-17 NOTE — H&P (Signed)
Psychiatric Admission Assessment Adult  Patient Identification: Blake Petersen MRN:  [id number] Date of Evaluation:  05/17/2015 Chief Complaint:  Bipolar, Depressed Principal Diagnosis: <principal problem not specified> Diagnosis:   Patient Active Problem List   Diagnosis Date Noted  . Bipolar affective disorder, current episode depressed [F31.30] 05/16/2015  . Bipolar disorder with depression [F31.30] 05/16/2015   History of Present Illness: Blake Petersen is a 24 year old AA male. Admitted to [facility] from the [facility] with complaints of insomnia, anger issues & snapping at people. He reports, "I went to the hospital on Tuesday. I have been off of my medicines for mental health. I ran out of them 3 months ago & I do not have an outpatient provider. I can't remember the names of the medications. I know I was taking them for depression & Bipolar disorder. I was doing well on them. After running out of my medicines, my anger issues flared up & worsened. I was snapping at everyone. I was not using any substances. I'm feeling suicidal, but safe here. I have a long familial Hx of mental illnesses"  Elements:  Location:  Bipolar affective disorder. Quality:  Worsening anger issues, lashing out, racing thoughts, mood swings. Severity:  Severe. Timing:  Current. Duration:  Chronic. Context:  "Ran out of meds 3 months ago, my anger worsened".  Associated Signs/Symptoms:  Depression Symptoms:  depressed mood, insomnia, hopelessness, anxiety,  (Hypo) Manic Symptoms:  Irritable Mood,  Anxiety Symptoms:  Excessive Worry,  Psychotic Symptoms:  Denies  PTSD Symptoms: NA  Total Time spent with patient: 1 hour  Past Medical History:  Past Medical History  Diagnosis Date  . Bipolar 1 disorder   . Asthma     Past Surgical History  Procedure Laterality Date  . Hand surgery    . Knee arthroscopy     Family History: History reviewed. No pertinent family history.  Social History:   History  Alcohol Use  . Yes    Comment: occ     History  Drug Use  . Yes  . Special: Marijuana    Social History   Social History  . Marital Status: Single    Spouse Name: N/A  . Number of Children: N/A  . Years of Education: N/A   Social History Main Topics  . Smoking status: Current Every Day Smoker -- 1.50 packs/day    Types: Cigarettes  . Smokeless tobacco: None  . Alcohol Use: Yes     Comment: occ  . Drug Use: Yes    Special: Marijuana  . Sexual Activity: No   Other Topics Concern  . None   Social History Narrative   Additional Social History:    Pain Medications: no Prescriptions: no Over the Counter: no History of alcohol / drug use?: Yes  Musculoskeletal: Strength & Muscle Tone: within normal limits Gait & Station: normal Patient leans: N/A  Psychiatric Specialty Exam: Physical Exam  Constitutional: He is oriented to person, place, and time. He appears well-developed and well-nourished.  HENT:  Head: Normocephalic.  Eyes: Pupils are equal, round, and reactive to light.  Neck: Normal range of motion.  Cardiovascular: Normal rate.   Respiratory: Effort normal.  GI: Soft.  Genitourinary:  Denies any issues in this area   Musculoskeletal: Normal range of motion.  Neurological: He is alert and oriented to person, place, and time.  Skin: Skin is warm and dry.  Psychiatric: His speech is normal and behavior is normal. Judgment and thought content normal. His mood  appears anxious. His affect is not angry, not blunt, not labile and not inappropriate. Cognition and memory are normal. He exhibits a depressed mood.    Review of Systems  Constitutional: Negative.   HENT: Negative.   Eyes: Negative.   Respiratory: Negative.   Cardiovascular: Negative.   Gastrointestinal: Negative.   Genitourinary: Negative.   Musculoskeletal: Negative.   Skin: Negative.   Neurological: Negative.   Endo/Heme/Allergies: Negative.   Psychiatric/Behavioral: Positive  for depression and suicidal ideas. Negative for hallucinations, memory loss and substance abuse. The patient is nervous/anxious and has insomnia.     Blood pressure 135/85, pulse 83, temperature 98.1 F (36.7 C), temperature source Oral, resp. rate 18, height 5' 8.5" (1.74 m), weight 123.832 kg (273 lb), SpO2 100 %.Body mass index is 40.9 kg/(m^2).  General Appearance: Casual  Eye Contact::  Fair  Speech:  Clear and Coherent  Volume:  Normal  Mood:  Angry and Anxious  Affect:  Restricted  Thought Process:  Coherent  Orientation:  Full (Time, Place, and Person)  Thought Content:  Rumination  Suicidal Thoughts:  No  Homicidal Thoughts:  No  Memory:  Grossly intact  Judgement:  Fair  Insight:  Fair  Psychomotor Activity:  Decreased  Concentration:  Fair  Recall:  Good  Fund of Knowledge:Fair  Language: Good  Akathisia:  No  Handed:  Right  AIMS (if indicated):     Assets:  Communication Skills Desire for Improvement  ADL's:  Intact  Cognition: WNL  Sleep:      Risk to Self: Is patient at risk for suicide?: Yes Risk to Others: No Prior Inpatient Therapy: Yes Prior Outpatient Therapy: Yes  Alcohol Screening: Patient refused Alcohol Screening Tool: Yes 2. How many drinks containing alcohol do you have on a typical day when you are drinking?:  (Patient refused to answer) Brief Intervention: Patient declined brief intervention  Allergies:   Allergies  Allergen Reactions  . Iodine Anaphylaxis  . Shellfish Allergy Anaphylaxis   Lab Results:  Results for orders placed or performed during the hospital encounter of 05/15/15 (from the past 48 hour(s))  Ethanol (ETOH)     Status: None   Collection Time: 05/15/15 12:53 PM  Result Value Ref Range   Alcohol, Ethyl (B) <5 <5 mg/dL    Comment:        LOWEST DETECTABLE LIMIT FOR SERUM ALCOHOL IS 5 mg/dL FOR MEDICAL PURPOSES ONLY   Salicylate level     Status: None   Collection Time: 05/15/15 12:53 PM  Result Value Ref Range    Salicylate Lvl <3.5 2.8 - 30.0 mg/dL  Acetaminophen level     Status: Abnormal   Collection Time: 05/15/15 12:53 PM  Result Value Ref Range   Acetaminophen (Tylenol), Serum <10 (L) 10 - 30 ug/mL    Comment:        THERAPEUTIC CONCENTRATIONS VARY SIGNIFICANTLY. A RANGE OF 10-30 ug/mL MAY BE AN EFFECTIVE CONCENTRATION FOR MANY PATIENTS. HOWEVER, SOME ARE BEST TREATED AT CONCENTRATIONS OUTSIDE THIS RANGE. ACETAMINOPHEN CONCENTRATIONS >150 ug/mL AT 4 HOURS AFTER INGESTION AND >50 ug/mL AT 12 HOURS AFTER INGESTION ARE OFTEN ASSOCIATED WITH TOXIC REACTIONS.   Comprehensive metabolic panel     Status: None   Collection Time: 05/15/15 12:58 PM  Result Value Ref Range   Sodium 140 135 - 145 mmol/L   Potassium 3.8 3.5 - 5.1 mmol/L   Chloride 108 101 - 111 mmol/L   CO2 22 22 - 32 mmol/L   Glucose, Bld 92 65 -  99 mg/dL   BUN 12 6 - 20 mg/dL   Creatinine, Ser 0.78 0.61 - 1.24 mg/dL   Calcium 9.6 8.9 - 10.3 mg/dL   Total Protein 7.9 6.5 - 8.1 g/dL   Albumin 4.0 3.5 - 5.0 g/dL   AST 22 15 - 41 U/L   ALT 23 17 - 63 U/L   Alkaline Phosphatase 73 38 - 126 U/L   Total Bilirubin 0.6 0.3 - 1.2 mg/dL   GFR calc non Af Amer >60 >60 mL/min   GFR calc Af Amer >60 >60 mL/min    Comment: (NOTE) The eGFR has been calculated using the CKD EPI equation. This calculation has not been validated in all clinical situations. eGFR's persistently <60 mL/min signify possible Chronic Kidney Disease.    Anion gap 10 5 - 15  CBC     Status: None   Collection Time: 05/15/15 12:58 PM  Result Value Ref Range   WBC 5.4 4.0 - 10.5 K/uL   RBC 4.63 4.22 - 5.81 MIL/uL   Hemoglobin 13.3 13.0 - 17.0 g/dL   HCT 39.8 39.0 - 52.0 %   MCV 86.0 78.0 - 100.0 fL   MCH 28.7 26.0 - 34.0 pg   MCHC 33.4 30.0 - 36.0 g/dL   RDW 15.1 11.5 - 15.5 %   Platelets 391 150 - 400 K/uL  Urine rapid drug screen (hosp performed) (Not at [facility])     Status: None   Collection Time: 05/15/15  6:49 PM  Result Value Ref Range   Opiates  NONE DETECTED NONE DETECTED   Cocaine NONE DETECTED NONE DETECTED   Benzodiazepines NONE DETECTED NONE DETECTED   Amphetamines NONE DETECTED NONE DETECTED   Tetrahydrocannabinol NONE DETECTED NONE DETECTED   Barbiturates NONE DETECTED NONE DETECTED    Comment:        DRUG SCREEN FOR MEDICAL PURPOSES ONLY.  IF CONFIRMATION IS NEEDED FOR ANY PURPOSE, NOTIFY LAB WITHIN 5 DAYS.        LOWEST DETECTABLE LIMITS FOR URINE DRUG SCREEN Drug Class       Cutoff (ng/mL) Amphetamine      1000 Barbiturate      200 Benzodiazepine   076 Tricyclics       226 Opiates          300 Cocaine          300 THC              50    Current Medications: Current Facility-Administered Medications  Medication Dose Route Frequency Provider Last Rate Last Dose  . acetaminophen (TYLENOL) tablet 650 mg  650 mg Oral Q4H PRN Delfin Gant, NP      . alum & mag hydroxide-simeth (MAALOX/MYLANTA) 200-200-20 MG/5ML suspension 30 mL  30 mL Oral PRN Delfin Gant, NP      . carbamazepine (TEGRETOL) tablet 200 mg  200 mg Oral BID PC Delfin Gant, NP   200 mg at 05/17/15 0859  . FLUoxetine (PROZAC) capsule 10 mg  10 mg Oral Daily Delfin Gant, NP   10 mg at 05/17/15 0859  . ibuprofen (ADVIL,MOTRIN) tablet 600 mg  600 mg Oral Q8H PRN Delfin Gant, NP      . nicotine (NICODERM CQ - dosed in mg/24 hours) patch 21 mg  21 mg Transdermal Daily Delfin Gant, NP   21 mg at 05/17/15 0900   PTA Medications: No prescriptions prior to admission   Previous Psychotropic Medications: Yes   Substance  Abuse History in the last 12 months:  No.  Consequences of Substance Abuse: NA  Results for orders placed or performed during the hospital encounter of 05/15/15 (from the past 72 hour(s))  Ethanol (ETOH)     Status: None   Collection Time: 05/15/15 12:53 PM  Result Value Ref Range   Alcohol, Ethyl (B) <5 <5 mg/dL    Comment:        LOWEST DETECTABLE LIMIT FOR SERUM ALCOHOL IS 5 mg/dL FOR MEDICAL  PURPOSES ONLY   Salicylate level     Status: None   Collection Time: 05/15/15 12:53 PM  Result Value Ref Range   Salicylate Lvl <6.2 2.8 - 30.0 mg/dL  Acetaminophen level     Status: Abnormal   Collection Time: 05/15/15 12:53 PM  Result Value Ref Range   Acetaminophen (Tylenol), Serum <10 (L) 10 - 30 ug/mL    Comment:        THERAPEUTIC CONCENTRATIONS VARY SIGNIFICANTLY. A RANGE OF 10-30 ug/mL MAY BE AN EFFECTIVE CONCENTRATION FOR MANY PATIENTS. HOWEVER, SOME ARE BEST TREATED AT CONCENTRATIONS OUTSIDE THIS RANGE. ACETAMINOPHEN CONCENTRATIONS >150 ug/mL AT 4 HOURS AFTER INGESTION AND >50 ug/mL AT 12 HOURS AFTER INGESTION ARE OFTEN ASSOCIATED WITH TOXIC REACTIONS.   Comprehensive metabolic panel     Status: None   Collection Time: 05/15/15 12:58 PM  Result Value Ref Range   Sodium 140 135 - 145 mmol/L   Potassium 3.8 3.5 - 5.1 mmol/L   Chloride 108 101 - 111 mmol/L   CO2 22 22 - 32 mmol/L   Glucose, Bld 92 65 - 99 mg/dL   BUN 12 6 - 20 mg/dL   Creatinine, Ser 0.78 0.61 - 1.24 mg/dL   Calcium 9.6 8.9 - 10.3 mg/dL   Total Protein 7.9 6.5 - 8.1 g/dL   Albumin 4.0 3.5 - 5.0 g/dL   AST 22 15 - 41 U/L   ALT 23 17 - 63 U/L   Alkaline Phosphatase 73 38 - 126 U/L   Total Bilirubin 0.6 0.3 - 1.2 mg/dL   GFR calc non Af Amer >60 >60 mL/min   GFR calc Af Amer >60 >60 mL/min    Comment: (NOTE) The eGFR has been calculated using the CKD EPI equation. This calculation has not been validated in all clinical situations. eGFR's persistently <60 mL/min signify possible Chronic Kidney Disease.    Anion gap 10 5 - 15  CBC     Status: None   Collection Time: 05/15/15 12:58 PM  Result Value Ref Range   WBC 5.4 4.0 - 10.5 K/uL   RBC 4.63 4.22 - 5.81 MIL/uL   Hemoglobin 13.3 13.0 - 17.0 g/dL   HCT 39.8 39.0 - 52.0 %   MCV 86.0 78.0 - 100.0 fL   MCH 28.7 26.0 - 34.0 pg   MCHC 33.4 30.0 - 36.0 g/dL   RDW 15.1 11.5 - 15.5 %   Platelets 391 150 - 400 K/uL  Urine rapid drug screen  (hosp performed) (Not at [facility])     Status: None   Collection Time: 05/15/15  6:49 PM  Result Value Ref Range   Opiates NONE DETECTED NONE DETECTED   Cocaine NONE DETECTED NONE DETECTED   Benzodiazepines NONE DETECTED NONE DETECTED   Amphetamines NONE DETECTED NONE DETECTED   Tetrahydrocannabinol NONE DETECTED NONE DETECTED   Barbiturates NONE DETECTED NONE DETECTED    Comment:        DRUG SCREEN FOR MEDICAL PURPOSES ONLY.  IF CONFIRMATION IS NEEDED FOR  ANY PURPOSE, NOTIFY LAB WITHIN 5 DAYS.        LOWEST DETECTABLE LIMITS FOR URINE DRUG SCREEN Drug Class       Cutoff (ng/mL) Amphetamine      1000 Barbiturate      200 Benzodiazepine   616 Tricyclics       073 Opiates          300 Cocaine          300 THC              50     Observation Level/Precautions:  15 minute checks  Laboratory:  Per ED  Psychotherapy: Group sessions   Medications: See medication changes  Consultations: As needed   Discharge Concerns: Mood stabiliity   Estimated LOS: 5-7 days  Other:     Psychological Evaluations: Yes   Treatment Plan Summary: Daily contact with patient to assess and evaluate symptoms and progress in treatment and Medication management: 1. Admit for crisis management and stabilization, estimated length of stay 3-5 days.  2. Medication management to reduce current symptoms to base line and improve the patient's overall level of functioning; Tegretol 200 mg for mood stabilization, Fluoxetine 10 mg for depression, Nicotine patch 21 mg  3. Treat health problems as indicated.  4. Develop treatment plan to decrease risk of relapse upon discharge and the need for readmission.  5. Psycho-social education regarding relapse prevention and self care.  6. Health care follow up as needed for medical problems; Lisinopril 10 mg for HTN.  7. Review, reconcile, and reinstate any pertinent home medications for other health issues where appropriate. 8. Call for consults with hospitalist for any  additional specialty patient care services as needed.  Medical Decision Making:  New problem, with additional work up planned, Review of Psycho-Social Stressors (1), Review or order clinical lab tests (1), Review and summation of old records (2), Review of Medication Regimen & Side Effects (2) and Review of New Medication or Change in Dosage (2)  I certify that inpatient services furnished can reasonably be expected to improve the patient's condition.   Encarnacion Slates, PMHNP, FNP-BC 9/21/201612:46 PM I personally assessed the patient, reviewed the physical exam and labs and formulated the treatment plan Geralyn Flash A. Sabra Heck, M.D.

## 2015-05-17 NOTE — [facility] Group Notes (Signed)
[facility] LCSW Group Therapy  05/17/2015 3:41 PM  Type of Therapy:  Group Therapy  Participation Level:  Did Not Attend, invited.    Summary of Progress/Problems: Emotion Regulation: This group focused on both positive and negative emotion identification and allowed group members to process ways to identify feelings, regulate negative emotions, and find healthy ways to manage internal/external emotions. Group members were asked to reflect on a time when their reaction to an emotion led to a negative outcome and explored how alternative responses using emotion regulation would have benefited them. Group members were also asked to discuss a time when emotion regulation was utilized when a negative emotion was experienced.   Cunningham, Anne C 05/17/2015, 3:41 PM  

## 2015-05-17 NOTE — Progress Notes (Signed)
Recreation Therapy Notes  Date: 09.21.2016 Time:  9:30am Location: [address] Group Room   Group Topic: Stress Management  Goal Area(s) Addresses:  Patient will actively participate in stress management techniques presented during session.   Behavioral Response: Did not attend.   Denise L Blanchfield, LRT/CTRS        Blanchfield, Denise L 05/17/2015 2:49 PM 

## 2015-05-17 NOTE — [facility] Group Notes (Signed)
[facility] LCSW Aftercare Discharge Planning Group Note  05/17/2015 8:45 AM  Pt did not attend, declined invitation.   Chad Cordial, LCSWA 05/17/2015 9:40 AM

## 2015-05-17 NOTE — Plan of Care (Signed)
Problem: Diagnosis: Increased Risk For Suicide Attempt Goal: STG-Patient Will Comply With Medication Regime Outcome: Progressing Patient compliant with medication regimen at this time.      

## 2015-05-17 NOTE — [facility] Suicide Risk Assessment (Signed)
[facility] INPATIENT:  Family/Significant Other Suicide Prevention Education  Suicide Prevention Education:  Patient Refusal for Family/Significant Other Suicide Prevention Education: The patient Blake Petersen has refused to provide written consent for family/significant other to be provided Family/Significant Other Suicide Prevention Education during admission and/or prior to discharge.  Physician notified.  Daryel Gerald B 05/17/2015, 4:40 PM

## 2015-05-18 MED ORDER — TRAZODONE HCL 50 MG PO TABS
75.0000 mg | ORAL_TABLET | Freq: Every evening | ORAL | Status: DC | PRN
  Administered 2015-05-18 – 2015-05-23 (×6): 75 mg via ORAL
  Filled 2015-05-18 (×17): qty 1

## 2015-05-18 MED ORDER — DIVALPROEX SODIUM ER 500 MG PO TB24
500.0000 mg | ORAL_TABLET | Freq: Every day | ORAL | Status: DC
  Administered 2015-05-18 – 2015-05-22 (×5): 500 mg via ORAL
  Filled 2015-05-18 (×8): qty 1

## 2015-05-18 MED ORDER — HYDROXYZINE HCL 25 MG PO TABS
25.0000 mg | ORAL_TABLET | Freq: Four times a day (QID) | ORAL | Status: DC | PRN
  Filled 2015-05-18: qty 10

## 2015-05-18 MED ORDER — FLUOXETINE HCL 20 MG PO CAPS
20.0000 mg | ORAL_CAPSULE | Freq: Every day | ORAL | Status: DC
  Administered 2015-05-19: 20 mg via ORAL
  Filled 2015-05-18 (×3): qty 1

## 2015-05-18 NOTE — Progress Notes (Signed)
Patient ID: Blake Petersen, male   DOB: 1990/11/26, 26 y.o.   MRN: [id number] D: Patient in dayroom on approach. Pt mood and affect appeared anxious. Pt reports he is glad to be back on his medication and tolerating medication well. Pt denies SI/HI/AVH and pain. Pt attended and participated in evening karaoke group. Cooperative with assessment.   A: Met with pt 1:1. Medications administered as prescribed. Support and encouragement provided. Pt encouraged to discuss feelings and come to staff with any question or concerns.   R: Patient remains safe and complaint with medications.

## 2015-05-18 NOTE — Progress Notes (Signed)
Adult Psychoeducational Group Note  Date:  05/18/2015 Time:  0900  Group Topic/Focus:  Orientation:   The focus of this group is to educate the patient on the purpose and policies of crisis stabilization and provide a format to answer questions about their admission.  The group details unit policies and expectations of patients while admitted.  Participation Level:  Did Not Attend  Participation Quality:    Affect:    Cognitive:    Insight:   Engagement in Group:    Modes of Intervention:    Additional Comments:    Joneisha Miles L 05/18/2015, 11:38 AM 

## 2015-05-18 NOTE — Progress Notes (Signed)
Adult Psychoeducational Group Note  Date:  05/18/2015 Time:  9:15 PM  Group Topic/Focus:  Wrap-Up Group:   The focus of this group is to help patients review their daily goal of treatment and discuss progress on daily workbooks.  Pt attended karaoke.   Cleotilde Neer 05/18/2015, 10:23 PM

## 2015-05-18 NOTE — [facility] Group Notes (Signed)
[facility] Mental [facility] Association Group Therapy 05/18/2015 1:15pm  Type of Therapy: Mental Health Association Presentation  Participation Level: Active  Participation Quality: Attentive  Affect: Appropriate  Cognitive: Oriented  Insight: Developing/Improving  Engagement in Therapy: Engaged  Modes of Intervention: Discussion, Education and Socialization  Summary of Progress/Problems: Mental Health Association (MHA) Speaker came to talk about his personal journey with substance abuse and addiction. The pt processed ways by which to relate to the speaker. MHA speaker provided handouts and educational information pertaining to groups and services offered by the [facility]. Pt was engaged in speaker's presentation and was receptive to resources provided.    Chad Cordial, LCSWA 05/18/2015 2:19 PM

## 2015-05-18 NOTE — Progress Notes (Signed)
Blake Petersen has acclamated well to the 400 hall today. HE has been sittitng in the dayroom.Marland Kitchen...watching TV most of the day..  he has talked on the phone as well.    A He denies suiicidality and is requested to complete his daily assessment but has not done so as of yet.    R Safety in place.

## 2015-05-18 NOTE — Progress Notes (Signed)
D: Pt continues to isolate self in room. Pt was minimal in his interaction with nurse; he states, "I am getting pissed off right now; you keep asking me the same questions; at have told the other that I would start talking and going to groups tomorrow." Pt however states, "I am anxious, I am depressed and no, I am not going to hurt myself or others."   A: Medications offered as prescribed.  Support, encouragement, and safe environment provided.  15-minute safety checks continue.  R: Pt refused schedule medications.  Pt did not attend group. Safety checks continue.

## 2015-05-18 NOTE — Progress Notes (Addendum)
[facility] MD Progress Note  05/18/2015 7:18 PM Blake Petersen  MRN:  [id number] Subjective:   Patient reports ongoing dysphoria, irritability. He denies medication side effects. States that he now remembers that " the other medication I had been taking was Depakote ". Feels that Depakote was working to decrease his mood swings and irritabilty. Objective : I have reviewed case with treatment team and have met with patient. Remains irritable, somewhat dysphoric but better than upon admission- smiles at times appropriately today and overall seems better related . He does state he still feels " on edge" and easily frustrated . Denies medication side effects. Some group participation today, less isolative . No disruptive or agitated behaviors on unit . Principal Problem:  Bipolar Disorder, Depressed  Diagnosis:   Patient Active Problem List   Diagnosis Date Noted  . Bipolar affective disorder, current episode depressed [F31.30] 05/16/2015  . Bipolar disorder with depression [F31.30] 05/16/2015   Total Time spent with patient: 20 minutes   Past Medical History:  Past Medical History  Diagnosis Date  . Bipolar 1 disorder   . Asthma     Past Surgical History  Procedure Laterality Date  . Hand surgery    . Knee arthroscopy     Family History: History reviewed. No pertinent family history. Social History:  History  Alcohol Use  . Yes    Comment: occ     History  Drug Use  . Yes  . Special: Marijuana    Social History   Social History  . Marital Status: Single    Spouse Name: N/A  . Number of Children: N/A  . Years of Education: N/A   Social History Main Topics  . Smoking status: Current Every Day Smoker -- 1.50 packs/day    Types: Cigarettes  . Smokeless tobacco: None  . Alcohol Use: Yes     Comment: occ  . Drug Use: Yes    Special: Marijuana  . Sexual Activity: No   Other Topics Concern  . None   Social History Narrative   Additional History:    Sleep:  Fair    Appetite:  Good   Assessment:   Musculoskeletal: Strength & Muscle Tone: within normal limits Gait & Station: normal Patient leans: N/A   Psychiatric Specialty Exam: Physical Exam  ROS- denies chest pain, denies SOB, no nausea or vomiting   Blood pressure 129/90, pulse 89, temperature 98.1 F (36.7 C), temperature source Oral, resp. rate 18, height 5' 8.5" (1.74 m), weight 273 lb (123.832 kg), SpO2 100 %.Body mass index is 40.9 kg/(m^2).  General Appearance: Fairly Groomed  Engineer, water::  improved   Speech:  Normal Rate  Volume:  Normal  Mood:  improved compared to admission but still irritable, dysphoric   Affect:  Congruent- fuller in range today  Thought Process:  Linear  Orientation:  Full (Time, Place, and Person)  Thought Content:  denies hallucinations, no delusions expressed, not internally preoccupied   Suicidal Thoughts:  No- at this time denies any plan or intention of hurting self or of SI   Homicidal Thoughts:  No- at this time denies plan or intention of hurting anyone, to include any plan or intention of violence towards his boss/employer   Memory:  recent and remote grossly intact   Judgement:  Fair  Insight:  Fair  Psychomotor Activity:  Normal  Concentration:  Good  Recall:  Good  Fund of Knowledge:Good  Language: Good  Akathisia:  Negative  Handed:  Right  AIMS (  if indicated):     Assets:  Communication Skills Desire for Improvement Resilience  ADL's:   Fair- improving    Cognition: WNL  Sleep:        Current Medications: Current Facility-Administered Medications  Medication Dose Route Frequency Provider Last Rate Last Dose  . acetaminophen (TYLENOL) tablet 650 mg  650 mg Oral Q4H PRN Delfin Gant, NP      . alum & mag hydroxide-simeth (MAALOX/MYLANTA) 200-200-20 MG/5ML suspension 30 mL  30 mL Oral PRN Delfin Gant, NP      . carbamazepine (TEGRETOL) tablet 200 mg  200 mg Oral BID PC Delfin Gant, NP   200 mg at 05/18/15  1828  . FLUoxetine (PROZAC) capsule 10 mg  10 mg Oral Daily Delfin Gant, NP   10 mg at 05/18/15 1215  . ibuprofen (ADVIL,MOTRIN) tablet 600 mg  600 mg Oral Q8H PRN Delfin Gant, NP      . lisinopril (PRINIVIL,ZESTRIL) tablet 10 mg  10 mg Oral Once Encarnacion Slates, NP   10 mg at 05/17/15 1828  . nicotine (NICODERM CQ - dosed in mg/24 hours) patch 21 mg  21 mg Transdermal Daily Delfin Gant, NP   21 mg at 05/18/15 1216  . traZODone (DESYREL) tablet 50 mg  50 mg Oral QHS,MR X 1 Laverle Hobby, PA-C   Stopped at 05/17/15 2300    Lab Results: No results found for this or any previous visit (from the past 48 hour(s)).  Physical Findings: AIMS: Facial and Oral Movements Muscles of Facial Expression: None, normal Lips and Perioral Area: None, normal Jaw: None, normal Tongue: None, normal,Extremity Movements Upper (arms, wrists, hands, fingers): None, normal Lower (legs, knees, ankles, toes): None, normal, Trunk Movements Neck, shoulders, hips: None, normal, Overall Severity Severity of abnormal movements (highest score from questions above): None, normal Incapacitation due to abnormal movements: None, normal Patient's awareness of abnormal movements (rate only patient's report): No Awareness, Dental Status Current problems with teeth and/or dentures?: No Does patient usually wear dentures?: No  CIWA:    COWS:      Assessment - patient remains dysphoric and irritable ,but presentation clearly improved compared to admission- he has a more reactive affect, improved eye contact, and is more visible on the unit. He has had no agitated or disruptive behaviors on unit thus far. He is tolerating medications well but today reports he remembers he had been on Depakote with good response .   Treatment Plan Summary: Daily contact with patient to assess and evaluate symptoms and progress in treatment, Medication management, Plan inpatient admission and medications as below D/C Tegretol-  see above  Start Depakote ER 500 mgrs QHS for mood disorder- side effects discussed - see above  Continue Prozac 10 mgrs QDAY for depression  Increase  Trazodone to 75  mgrs QHS for insomnia as needed  Continue Nicoderm to manage nicotine cravings Start Vistaril 25 mgrs Q 6 hours PRN for Anxiety as needed   Encourage group/ milieu  participation, to address coping skills, improve mood  Treatment team working on disposition options  Patient expresses concern about family history of DM and lipid issues, requests lipid panel and will also order Hgb A1C       COBOS, FERNANDO 05/18/2015, 7:18 PM

## 2015-05-19 LAB — LIPID PANEL
CHOLESTEROL: 196 mg/dL (ref 0–200)
HDL: 26 mg/dL — ABNORMAL LOW (ref >40)
LDL Cholesterol: 129 mg/dL — ABNORMAL HIGH (ref 0–99)
Total CHOL/HDL Ratio: 7.5 RATIO
Triglycerides: 206 mg/dL — ABNORMAL HIGH (ref <150)
VLDL: 41 mg/dL — ABNORMAL HIGH (ref 0–40)

## 2015-05-19 MED ORDER — FLUOXETINE HCL 20 MG PO CAPS
20.0000 mg | ORAL_CAPSULE | Freq: Every day | ORAL | Status: DC
  Administered 2015-05-20 – 2015-05-23 (×4): 20 mg via ORAL
  Filled 2015-05-19 (×4): qty 1
  Filled 2015-05-19: qty 7
  Filled 2015-05-19: qty 1

## 2015-05-19 NOTE — [facility] Group Notes (Signed)
[facility] LCSW Group Therapy 05/19/2015 1:15pm  Type of Therapy: Group Therapy- Feelings Around Relapse and Recovery  Participation Level: Active   Participation Quality:  Appropriate  Affect:  Appropriate  Cognitive: Alert and Oriented   Insight:  Developing   Engagement in Therapy: Developing/Improving and Engaged   Modes of Intervention: Clarification, Confrontation, Discussion, Education, Exploration, Limit-setting, Orientation, Problem-solving, Rapport Building, Dance movement psychotherapist, Socialization and Support  Summary of Progress/Problems: The topic for today was feelings about relapse. The group discussed what relapse prevention is to them and identified triggers that they are on the path to relapse. Members also processed their feeling towards relapse and were able to relate to common experiences. Group also discussed coping skills that can be used for relapse prevention.     Therapeutic Modalities:   Cognitive Behavioral Therapy Solution-Focused Therapy Assertiveness Training Relapse Prevention Therapy    Lamar Sprinkles [phone] 05/19/2015 4:59 PM

## 2015-05-19 NOTE — Progress Notes (Signed)
Patient ID: Blake Petersen, male   DOB: 09/03/1990, 24 y.o.   MRN: [id number] [facility] MD Progress Note  05/19/2015 6:09 PM Maher Prothero  MRN:  [id number] Subjective:    He reports he is " starting to feel better", less irritable, less angry, " more relaxed". States he feels medication is working well for him at this time and does not endorse side effects, except that he feels AM Prozac is causing sedation and prefers to take this medication at HS.  He is future oriented and states he has made decision not to return to [employer]. He expresses interest in going to [employer] after discharge .  Objective : I have reviewed case with treatment team and have met with patient. Patient significantly improved compared to admission- presents with improved eye contact, improved range of affect, states he feels better, less irritable , and is visible on unit, interacting with peers appropriately.  No disruptive or agitated behaviors on unit . Medications well tolerated except for some AM sedation. As noted, future oriented and more optimistic. Thus far tolerating Depakote trial well, and states " it is amazing how it works - I am less angry, calmer " Lipid panel- elevated triglycerides, low HDL   Principal Problem:  Bipolar Disorder, Depressed  Diagnosis:   Patient Active Problem List   Diagnosis Date Noted  . Bipolar affective disorder, current episode depressed [F31.30] 05/16/2015  . Bipolar disorder with depression [F31.30] 05/16/2015   Total Time spent with patient: 20 minutes   Past Medical History:  Past Medical History  Diagnosis Date  . Bipolar 1 disorder   . Asthma     Past Surgical History  Procedure Laterality Date  . Hand surgery    . Knee arthroscopy     Family History: History reviewed. No pertinent family history. Social History:  History  Alcohol Use  . Yes    Comment: occ     History  Drug Use  . Yes  . Special: Marijuana    Social History    Social History  . Marital Status: Single    Spouse Name: N/A  . Number of Children: N/A  . Years of Education: N/A   Social History Main Topics  . Smoking status: Current Every Day Smoker -- 1.50 packs/day    Types: Cigarettes  . Smokeless tobacco: None  . Alcohol Use: Yes     Comment: occ  . Drug Use: Yes    Special: Marijuana  . Sexual Activity: No   Other Topics Concern  . None   Social History Narrative   Additional History:    Sleep:   Improved   Appetite:  Good   Assessment:   Musculoskeletal: Strength & Muscle Tone: within normal limits Gait & Station: normal Patient leans: N/A   Psychiatric Specialty Exam: Physical Exam  ROS- denies chest pain, denies SOB, no nausea or vomiting , no tremors   Blood pressure 136/82, pulse 78, temperature 98.1 F (36.7 C), temperature source Oral, resp. rate 16, height 5' 8.5" (1.74 m), weight 273 lb (123.832 kg), SpO2 100 %.Body mass index is 40.9 kg/(m^2).  General Appearance: improved grooming  Eye Contact::  improved   Speech:  Normal Rate  Volume:  Normal  Mood:  better overall mood, no longer feeling irritable   Affect:   Appropriate, reactive   Thought Process:  Linear  Orientation:  Full (Time, Place, and Person)  Thought Content:  denies hallucinations, no delusions expressed, not internally  preoccupied   Suicidal Thoughts:  No- at this time denies any plan or intention of hurting self or of SI   Homicidal Thoughts:  No- at this time denies plan or intention of hurting anyone, to include any plan or intention of violence towards his boss/employer   Memory:  recent and remote grossly intact   Judgement:  Other:  improving  Insight:  improving   Psychomotor Activity:  Normal- no agitation or restlessness  Concentration:  Good  Recall:  Good  Fund of Knowledge:Good  Language: Good  Akathisia:  Negative  Handed:  Right  AIMS (if indicated):     Assets:  Communication Skills Desire for  Improvement Resilience  ADL's:   Fair- improving    Cognition: WNL  Sleep:        Current Medications: Current Facility-Administered Medications  Medication Dose Route Frequency Provider Last Rate Last Dose  . acetaminophen (TYLENOL) tablet 650 mg  650 mg Oral Q4H PRN Delfin Gant, NP      . alum & mag hydroxide-simeth (MAALOX/MYLANTA) 200-200-20 MG/5ML suspension 30 mL  30 mL Oral PRN Delfin Gant, NP      . divalproex (DEPAKOTE ER) 24 hr tablet 500 mg  500 mg Oral QHS Jenne Campus, MD   500 mg at 05/18/15 2148  . [START ON 05/20/2015] FLUoxetine (PROZAC) capsule 20 mg  20 mg Oral QHS Myer Peer Cobos, MD      . hydrOXYzine (ATARAX/VISTARIL) tablet 25 mg  25 mg Oral Q6H PRN Myer Peer Cobos, MD      . ibuprofen (ADVIL,MOTRIN) tablet 600 mg  600 mg Oral Q8H PRN Delfin Gant, NP      . lisinopril (PRINIVIL,ZESTRIL) tablet 10 mg  10 mg Oral Once Encarnacion Slates, NP   10 mg at 05/17/15 1828  . nicotine (NICODERM CQ - dosed in mg/24 hours) patch 21 mg  21 mg Transdermal Daily Delfin Gant, NP   21 mg at 05/19/15 0757  . traZODone (DESYREL) tablet 75 mg  75 mg Oral QHS,MR X 1 Jenne Campus, MD   75 mg at 05/18/15 2149    Lab Results:  Results for orders placed or performed during the hospital encounter of 05/16/15 (from the past 48 hour(s))  Lipid panel     Status: Abnormal   Collection Time: 05/19/15  6:40 AM  Result Value Ref Range   Cholesterol 196 0 - 200 mg/dL   Triglycerides 206 (H) <150 mg/dL   HDL 26 (L) >40 mg/dL   Total CHOL/HDL Ratio 7.5 RATIO   VLDL 41 (H) 0 - 40 mg/dL   LDL Cholesterol 129 (H) 0 - 99 mg/dL    Comment:        Total Cholesterol/HDL:CHD Risk Coronary Heart Disease Risk Table                     Men   Women  1/2 Average Risk   3.4   3.3  Average Risk       5.0   4.4  2 X Average Risk   9.6   7.1  3 X Average Risk  23.4   11.0        Use the calculated Patient Ratio above and the CHD Risk Table to determine the patient's CHD  Risk.        ATP III CLASSIFICATION (LDL):  <100     mg/dL   Optimal  100-129  mg/dL   Near  or Above                    Optimal  130-159  mg/dL   Borderline  160-189  mg/dL   High  >190     mg/dL   Very High Performed at [facility]     Physical Findings: AIMS: Facial and Oral Movements Muscles of Facial Expression: None, normal Lips and Perioral Area: None, normal Jaw: None, normal Tongue: None, normal,Extremity Movements Upper (arms, wrists, hands, fingers): None, normal Lower (legs, knees, ankles, toes): None, normal, Trunk Movements Neck, shoulders, hips: None, normal, Overall Severity Severity of abnormal movements (highest score from questions above): None, normal Incapacitation due to abnormal movements: None, normal Patient's awareness of abnormal movements (rate only patient's report): No Awareness, Dental Status Current problems with teeth and/or dentures?: No Does patient usually wear dentures?: No  CIWA:    COWS:      Assessment -  Significant improvement compared to admission- less irritable, better related, less isolative. Tolerating Depakote ER and Prozac trial well, but prefers Prozac at HS as states it makes him feel sedated in AM. More future oriented and no longer ruminative about his job as he states he has decided to quit . Denies any HI. Mild hypertriglyceridemia.   Treatment Plan Summary: Daily contact with patient to assess and evaluate symptoms and progress in treatment, Medication management, Plan inpatient admission and medications as below  Continue Depakote ER 500 mgrs QHS for mood disorder- side effects discussed Continue Prozac 20 mgrs QDAY for depression - change to QHS to address report of sedation Continue  Trazodone 75  mgrs QHS PRN  for insomnia as needed  Continue Nicoderm to manage nicotine cravings Continue  Vistaril 25 mgrs Q 6 hours PRN for Anxiety as needed   Encourage group/ milieu  participation, to address coping skills,  improve mood  Treatment team working on disposition options  Will request Dietary Consult for education related to diet improvement .       COBOS, FERNANDO 05/19/2015, 6:09 PM

## 2015-05-19 NOTE — Progress Notes (Signed)
Adult Psychoeducational Group Note  Date:  05/19/2015 Time:  9:28 PM  Group Topic/Focus:  Wrap-Up Group:   The focus of this group is to help patients review their daily goal of treatment and discuss progress on daily workbooks.  Participation Level:  Active  Participation Quality:  Appropriate and Attentive  Affect:  Appropriate  Cognitive:  Appropriate  Insight: Appropriate and Good  Engagement in Group:  Engaged  Modes of Intervention:  Education  Additional Comments:  Pt relapse prevention goal is to stay on medication. Pt goal when leaving here is also to stay on medication.   Merlinda Frederick 05/19/2015, 9:28 PM

## 2015-05-19 NOTE — Progress Notes (Signed)
Recreation Therapy Notes  Date: 09.23.2016 Time: 9:30am Location: [address] Group room   Group Topic: Stress Management  Goal Area(s) Addresses:  Patient will actively participate in stress management techniques presented during session.   Behavioral Response: Did not attend.   Denise L Blanchfield, LRT/CTRS        Blanchfield, Denise L 05/19/2015 1:51 PM 

## 2015-05-19 NOTE — Plan of Care (Signed)
Problem: Diagnosis: Increased Risk For Suicide Attempt Goal: STG-Patient Will Attend All Groups On The Unit Outcome: Progressing Pt attended evening karaoke this evening

## 2015-05-19 NOTE — Progress Notes (Signed)
Adult Psychoeducational Group Note  Date:  05/19/2015 Time:  6:52 PM  Group Topic/Focus:  Relapse Prevention Planning:   The focus of this group is to define relapse and discuss the need for planning to combat relapse.  Participation Level:  Minimal  Participation Quality:  Appropriate  Affect:  Flat  Cognitive:  Lacking  Insight: Limited  Engagement in Group:  Lacking  Modes of Intervention:  Discussion  Additional Comments: Pt did listen to discussion this morning in group, however pt did not have very much to say.  Pt states that he is not really feeling any better than the day he came and wants to make sure that he is on the right medication before leaving.  Caren R Crotts 05/19/2015, 6:52 PM

## 2015-05-19 NOTE — Progress Notes (Signed)
D Tavis remains guarded, minimizing his issues and focused on " when can i go home?". He attends his groups and he completed his daily assessment and on it he wrote he is positive for suicide ideation, he contracts safety  And he rates his derpession, hopelessness and anxiety " 7/7/7", respectively.   A He can be superficial but responds positively to this nurse when he is encouraged to stay focused on why he is in the hospital.    R Safety is in place and poc cont.

## 2015-05-20 LAB — HEMOGLOBIN A1C
Hgb A1c MFr Bld: 6.2 % — ABNORMAL HIGH (ref 4.8–5.6)
Mean Plasma Glucose: 131 mg/dL

## 2015-05-20 MED ORDER — MELOXICAM 7.5 MG PO TABS
15.0000 mg | ORAL_TABLET | Freq: Every day | ORAL | Status: DC
  Administered 2015-05-20 – 2015-05-24 (×5): 15 mg via ORAL
  Filled 2015-05-20: qty 2
  Filled 2015-05-20: qty 1
  Filled 2015-05-20: qty 14
  Filled 2015-05-20 (×5): qty 1

## 2015-05-20 NOTE — Progress Notes (Signed)
Blake Petersen remains flat, blunted and limited in his understanding and willngness to discuss mental illness. HE attends his groups.HE contracts for safety and he is happier today, that he is taking ALL of his psych meds, beginning tonight ( he spoke with is physician about this yesterday and requested this ).   A He is blunt. To the point. And shares with this nurse that he is " working on " plans for him to move.    R Safety is in place and poc moves forward.

## 2015-05-20 NOTE — Progress Notes (Signed)
Patient ID: Blake Petersen, male   DOB: [birth year], 24 y.o.   MRN: [id number] [facility] MD Progress Note  05/20/2015 4:40 PM Kiree Kolle  MRN:  [id number] Subjective:    He reports he is " starting to feel better", less irritable "I can deal with the nurses now, and he is no longer angry, " more relaxed". States he feels medication is working well for him at this time and does not endorse side effects, except that he feels that he wants to make sure his night time dose of Prozac will last him through the day. 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 He expresses interest in going to [facility] after discharge, so that he can start over after discussing with [facility].   Objective : I have reviewed case and have met with patient. Patient significantly improved compared to admission- presents with improved eye contact, improved range of affect, states he feels better, less irritable , and is visible on unit, interacting with peers appropriately.  No disruptive or agitated behaviors on unit . Medications well tolerated since some adjustment to his Prozac. As noted, future oriented and more optimistic. Thus far tolerating Depakote trial well, and states " it is amazing how it works - I am less angry, calmer " Lipid panel- elevated triglycerides, low HDL  Reviewed a1c 6.2, prediabetes. He is concerned about his cough which is from a viral  Illness he suspects bronchitis.  Principal Problem:  Bipolar Disorder, Depressed  Diagnosis:   Patient Active Problem List   Diagnosis Date Noted  . Bipolar affective disorder, current episode depressed [F31.30] 05/16/2015  . Bipolar disorder with depression [F31.30] 05/16/2015   Total Time spent with patient: 20 minutes   Past Medical History:  Past Medical History  Diagnosis Date  . Bipolar 1 disorder   . Asthma     Past Surgical History  Procedure Laterality Date  . Hand surgery    . Knee arthroscopy      Family History: History reviewed. No pertinent family history. Social History:  History  Alcohol Use  . Yes    Comment: occ     History  Drug Use  . Yes  . Special: Marijuana    Social History   Social History  . Marital Status: Single    Spouse Name: N/A  . Number of Children: N/A  . Years of Education: N/A   Social History Main Topics  . Smoking status: Current Every Day Smoker -- 1.50 packs/day    Types: Cigarettes  . Smokeless tobacco: None  . Alcohol Use: Yes     Comment: occ  . Drug Use: Yes    Special: Marijuana  . Sexual Activity: No   Other Topics Concern  . None   Social History Narrative   Additional History:    Sleep:   Improved   Appetite:  Good   Assessment:   Musculoskeletal: Strength & Muscle Tone: within normal limits Gait & Station: normal Patient leans: N/A   Psychiatric Specialty Exam: Physical Exam   Review of Systems  HENT: Positive for congestion.   Respiratory: Positive for cough (hx of bronchitis).   Musculoskeletal: Positive for back pain (suspect this is due to mattress).  - denies chest pain, denies SOB, no nausea or vomiting , no tremors   Blood pressure 133/82, pulse 83, temperature 98.2 F (36.8 C), temperature source Oral, resp. rate 16, height 5' 8.5" (1.74 m), weight 009.381  kg (273 lb), SpO2 100 %.Body mass index is 40.9 kg/(m^2).  General Appearance: improved grooming  Eye Contact::  improved   Speech:  Normal Rate  Volume:  Normal  Mood:  better overall mood, no longer feeling irritable   Affect:   Appropriate, reactive   Thought Process:  Linear  Orientation:  Full (Time, Place, and Person)  Thought Content:  denies hallucinations, no delusions expressed, not internally preoccupied   Suicidal Thoughts:  No- at this time denies any plan or intention of hurting self or of SI   Homicidal Thoughts:  No- at this time denies plan or intention of hurting anyone, to include any plan or intention of violence  towards his boss/employer   Memory:  recent and remote grossly intact   Judgement:  Other:  improving  Insight:  improving   Psychomotor Activity:  Normal- no agitation or restlessness  Concentration:  Good  Recall:  Good  Fund of Knowledge:Good  Language: Good  Akathisia:  Negative  Handed:  Right  AIMS (if indicated):     Assets:  Communication Skills Desire for Improvement Resilience  ADL's:   Fair- improving    Cognition: WNL  Sleep:  Number of Hours: 6.25     Current Medications: Current Facility-Administered Medications  Medication Dose Route Frequency Provider Last Rate Last Dose  . acetaminophen (TYLENOL) tablet 650 mg  650 mg Oral Q4H PRN Delfin Gant, NP      . alum & mag hydroxide-simeth (MAALOX/MYLANTA) 200-200-20 MG/5ML suspension 30 mL  30 mL Oral PRN Delfin Gant, NP      . divalproex (DEPAKOTE ER) 24 hr tablet 500 mg  500 mg Oral QHS Jenne Campus, MD   500 mg at 05/19/15 2120  . FLUoxetine (PROZAC) capsule 20 mg  20 mg Oral QHS Myer Peer Cobos, MD      . hydrOXYzine (ATARAX/VISTARIL) tablet 25 mg  25 mg Oral Q6H PRN Myer Peer Cobos, MD      . ibuprofen (ADVIL,MOTRIN) tablet 600 mg  600 mg Oral Q8H PRN Delfin Gant, NP      . lisinopril (PRINIVIL,ZESTRIL) tablet 10 mg  10 mg Oral Once Encarnacion Slates, NP   10 mg at 05/17/15 1828  . meloxicam (MOBIC) tablet 15 mg  15 mg Oral Daily Nanci Pina, FNP      . nicotine (NICODERM CQ - dosed in mg/24 hours) patch 21 mg  21 mg Transdermal Daily Delfin Gant, NP   21 mg at 05/20/15 0949  . traZODone (DESYREL) tablet 75 mg  75 mg Oral QHS,MR X 1 Jenne Campus, MD   75 mg at 05/19/15 2216    Lab Results:  Results for orders placed or performed during the hospital encounter of 05/16/15 (from the past 48 hour(s))  Lipid panel     Status: Abnormal   Collection Time: 05/19/15  6:40 AM  Result Value Ref Range   Cholesterol 196 0 - 200 mg/dL   Triglycerides 206 (H) <150 mg/dL   HDL 26 (L) >40  mg/dL   Total CHOL/HDL Ratio 7.5 RATIO   VLDL 41 (H) 0 - 40 mg/dL   LDL Cholesterol 129 (H) 0 - 99 mg/dL    Comment:        Total Cholesterol/HDL:CHD Risk Coronary Heart Disease Risk Table                     Men   Women  1/2 Average Risk  3.4   3.3  Average Risk       5.0   4.4  2 X Average Risk   9.6   7.1  3 X Average Risk  23.4   11.0        Use the calculated Patient Ratio above and the CHD Risk Table to determine the patient's CHD Risk.        ATP III CLASSIFICATION (LDL):  <100     mg/dL   Optimal  100-129  mg/dL   Near or Above                    Optimal  130-159  mg/dL   Borderline  160-189  mg/dL   High  >190     mg/dL   Very High Performed at [facility]   Hemoglobin A1c     Status: Abnormal   Collection Time: 05/19/15  6:40 AM  Result Value Ref Range   Hgb A1c MFr Bld 6.2 (H) 4.8 - 5.6 %    Comment: (NOTE)         Pre-diabetes: 5.7 - 6.4         Diabetes: >6.4         Glycemic control for adults with diabetes: <7.0    Mean Plasma Glucose 131 mg/dL    Comment: (NOTE) Performed At: [facility] [place], [place] [id number] Lindon Romp MD [id number] Performed at [facility]     Physical Findings: AIMS: Facial and Oral Movements Muscles of Facial Expression: None, normal Lips and Perioral Area: None, normal Jaw: None, normal Tongue: None, normal,Extremity Movements Upper (arms, wrists, hands, fingers): None, normal Lower (legs, knees, ankles, toes): None, normal, Trunk Movements Neck, shoulders, hips: None, normal, Overall Severity Severity of abnormal movements (highest score from questions above): None, normal Incapacitation due to abnormal movements: None, normal Patient's awareness of abnormal movements (rate only patient's report): No Awareness, Dental Status Current problems with teeth and/or dentures?: No Does patient usually wear dentures?: No  CIWA:    COWS:      Assessment -   Significant improvement compared to admission- less irritable, better related, less isolative. Tolerating Depakote ER and Prozac trial well, but prefers Prozac at HS as states it makes him feel sedated in AM. More future oriented and no longer ruminative about his job as he states he has decided to quit . Denies any HI. Mild hypertriglyceridemia.  Treatment Plan Summary: Daily contact with patient to assess and evaluate symptoms and progress in treatment, Medication management, Plan inpatient admission and medications as below  Continue Depakote ER 500 mgrs QHS for mood disorder- side effects discussed Continue Prozac 20 mgrs QDAY for depression - change to QHS to address report of sedation Continue  Trazodone 75  mgrs QHS PRN  for insomnia as needed  Continue Nicoderm to manage nicotine cravings Continue  Vistaril 25 mgrs Q 6 hours PRN for Anxiety as needed   Encourage group/ milieu  participation, to address coping skills, improve mood  Treatment team working on disposition options  Will request Dietary Consult for education related to diet improvement .    Priscille Loveless S 05/20/2015, 4:40 PM  Reviewed the information documented and agree with the treatment plan.  JONNALAGADDA,JANARDHAHA R. 05/21/2015 4:18 PM

## 2015-05-20 NOTE — Progress Notes (Signed)
Adult Psychoeducational Group Note  Date:  05/20/2015 Time:  9:54 PM  Group Topic/Focus:  Wrap-Up Group:   The focus of this group is to help patients review their daily goal of treatment and discuss progress on daily workbooks.  Participation Level:  Active  Participation Quality:  Appropriate  Affect:  Appropriate  Cognitive:  Appropriate  Insight: Appropriate  Engagement in Group:  Engaged  Modes of Intervention:  Discussion  Additional Comments: The patient expressed that he attended group.  Octavio Manns 05/20/2015, 9:54 PM

## 2015-05-20 NOTE — [facility] Group Notes (Signed)
[facility] Group Notes:  (Nursing/MHT/Case Management/Adjunct)  Date:  05/20/2015  Time:  9:33 AM  Type of Therapy:  Psychoeducational Skills  Participation Level:  Active  Participation Quality:  Appropriate  Affect:  Appropriate  Cognitive:  Appropriate  Insight:  Appropriate  Engagement in Group:  Engaged  Modes of Intervention:  Discussion  Summary of Progress/Problems: Pt did attend self inventory group.    Blake Petersen, Blake Petersen 05/20/2015, 9:33 AM 

## 2015-05-20 NOTE — Progress Notes (Signed)
Patient ID: Blake Petersen, male   DOB: 02-07-1991, 24 y.o.   MRN: [id number] Adult Psychoeducational Group Note  Date:  05/20/2015 Time:  10:00am  Group Topic/Focus:  Identifying Needs:   The focus of this group is to help patients identify their personal needs that have been historically problematic and identify healthy behaviors to address their needs.  Participation Level:  Minimal  Participation Quality:  Attentive and Resistant  Affect:  Flat  Cognitive:  Alert and Oriented  Insight: Lacking  Engagement in Group:  Defensive  Modes of Intervention:  Activity, Discussion, Education and Support  Additional Comments:  Pt unwilling to participate in group, states "I don't want to talk to while I am here."  Aurora Mask 05/20/2015, 11:00 AM

## 2015-05-20 NOTE — Progress Notes (Signed)
D: Pt was playing cards with and laughing with his peers during most of the shift. Stated, "I had fun. Everything went ok, just gotta wait til Monday when my regular dr comes in". Pt states that his medications are working. When asked how he knows. Pt stated, "Because I'm not as angry as I used to be".   A:  Support and encouragement was offered. 15 min checks continued for safety.  R: Pt remains safe.

## 2015-05-20 NOTE — [facility] Group Notes (Signed)
[facility] LCSW Group Therapy  05/20/2015   11:10 AM  Type of Therapy:  Group Therapy  Participation Level:  Active  Participation Quality:  Intrusive and Monopolizing  Affect:  Angry  Cognitive:  Oriented  Insight:  Limited  Engagement in Therapy:  Limited  Modes of Intervention:  Discussion, Exploration, Rapport Building, Socialization, Redirection and Support  Summary of Progress/Problems:  Summary of Progress/Problems: The main focus of today's process group was for the patient to identify ways in which they have in the past sabotaged their own recovery. Motivational Interviewing was utilized to ask the group members what they get out of their substance use, and what reasons they may have for wanting to change. The Stages of Change were explained using a handout, and patients identified where they currently are with regard to stages of change. The patient expressed that he self sabotages by not taking his medications. He acknowledges that his anger often creates drama for himself and others yet states there is no need for change as "theses days you have to protect yourself before someone gets you." Patient was resistant to processing the outcomes of his physical anger and when unable to engage facilitator in debates he became quiet and drowsy.   Carney Bern, LCSW

## 2015-05-20 NOTE — Progress Notes (Signed)
Pt who had always been isolative and withdrawn was seen in the dayroom making appropriate conversations with peers. Pt at time of assessment completely denies any form of depression, anxiety, pain, SI, HI and AVH. He states, "I am getting my medication now; I will just fine." Pt however, is also sometime seen keeping to self with a depressed affect.  A: Medications offered as prescribed.  Pt attended wrap-up group. Support, encouragement, and safe environment provided.  15-minute safety checks continue.  R: Pt refused schedule medications.  Pt did not attend group. Safety checks continue.

## 2015-05-21 NOTE — [facility] Group Notes (Signed)
[facility] Group Notes:  (Nursing/MHT/Case Management/Adjunct)  Date:  05/21/2015  Time:  10:04 AM  Type of Therapy:  Psychoeducational Skills  Participation Level:  Active  Participation Quality:  Appropriate  Affect:  Appropriate  Cognitive:  Appropriate  Insight:  Appropriate  Engagement in Group:  Engaged  Modes of Intervention:  Discussion  Summary of Progress/Problems: Pt did attend self inventory group.  Blake Petersen, Blake Petersen 05/21/2015, 10:04 AM 

## 2015-05-21 NOTE — [facility] Group Notes (Signed)
[facility] LCSW Group Therapy  05/21/2015   1:15 PM   Type of Therapy:  Group Therapy  Participation Level:  Minimal  Participation Quality:  Minimal  Affect:  Appropriate, flat, depressed  Cognitive:  Alert and Appropriate  Insight:  Limited, lacking  Engagement in Therapy:  Limited, lacking  Modes of Intervention:  Clarification, Confrontation, Discussion, Education, Exploration, Limit-setting, Orientation, Problem-solving, Rapport Building, Dance movement psychotherapist, Socialization and Support  Summary of Progress/Problems: The main focus of today's process group was to identify the patient's current support system and decide on other supports that can be put in place.  An emphasis was placed on using counselor, doctor, therapy groups, 12-step groups, and problem-specific support groups to expand supports, as well as doing something different than has been done before. Pt was disruptive in group, making jokes and distracting peers. When called upon pt declined to talk about himself or be serious with group discussion.  Pt appeared to actively listen to group discussion.    Chelsea Horton, LCSW 05/21/2015 2:30 pm

## 2015-05-21 NOTE — Progress Notes (Signed)
Adult Psychoeducational Group Note  Date:  05/21/2015 Time:  11:07 PM  Group Topic/Focus:  Wrap-Up Group:   The focus of this group is to help patients review their daily goal of treatment and discuss progress on daily workbooks.  Participation Level:  Active  Participation Quality:  Appropriate and Attentive  Affect:  Appropriate  Cognitive:  Appropriate  Insight: Appropriate  Engagement in Group:  Engaged  Modes of Intervention:  Education  Additional Comments:  Pt mentioned his healthy support system to be one of his friends he met inside of here. He stated that he keeps him motivated.   Blake Petersen 05/21/2015, 11:07 PM

## 2015-05-21 NOTE — [facility] Group Notes (Signed)
[facility] Group Notes:  (Nursing/MHT/Case Management/Adjunct)  Date:  05/21/2015  Time:  10:27 AM  Type of Therapy:  Psychoeducational Skills  Participation Level:  Active  Participation Quality:  Appropriate  Affect:  Appropriate  Cognitive:  Appropriate  Insight:  Appropriate  Engagement in Group:  Engaged  Modes of Intervention:  Discussion  Summary of Progress/Problems: Pt did attend healthy support systems group.  Jacquelyne Balint Shanta 05/21/2015, 10:27 AM

## 2015-05-21 NOTE — Progress Notes (Signed)
Patient ID: Blake Petersen, male   DOB: Jan 01, 1991, 24 y.o.   MRN: [id number] [facility] MD Progress Note  05/21/2015 2:53 PM Blake Petersen  MRN:  [id number]   Subjective:    He reports he feels much better. States he feels medication is working well for him at this time, however he continues to endorse feelings of sadness and depression. 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 He expresses interest in going to [facility] after discharge, so that he can start over after discussing with [facility]. Currently rates his depression 9/10, anxiety 8/10 and hopelessness 0/10. He states unless we help with his stress levels he is always going to have high levels of anxiety and depression. He would like to have his Prozac dose increases because he feels as though it is not helping him. He is anticipating discharge on Monday or Tuesday.   Objective : I have reviewed case and have met with patient. Patient significantly improved compared to admission- presents with improved eye contact, improved range of affect, states he feels better, less irritable , and is visible on unit, interacting with peers appropriately. No disruptive or agitated behaviors on unit.Medications well tolerated since some adjustment to his Prozac. As noted, future oriented and more optimistic. Thus far tolerating Depakote trial well, and states " it is amazing how it works - I am less angry, calmer " Lipid panel- elevated triglycerides, low HDL  Reviewed a1c 6.2, prediabetes. He is concerned about his cough which is from a viral  Illness he suspects bronchitis.   Principal Problem:  Bipolar Disorder, Depressed  Diagnosis:   Patient Active Problem List   Diagnosis Date Noted  . Bipolar affective disorder, current episode depressed [F31.30] 05/16/2015  . Bipolar disorder with depression [F31.30] 05/16/2015   Total Time spent with patient: 20 minutes   Past Medical History:  Past Medical  History  Diagnosis Date  . Bipolar 1 disorder   . Asthma     Past Surgical History  Procedure Laterality Date  . Hand surgery    . Knee arthroscopy     Family History: History reviewed. No pertinent family history. Social History:  History  Alcohol Use  . Yes    Comment: occ     History  Drug Use  . Yes  . Special: Marijuana    Social History   Social History  . Marital Status: Single    Spouse Name: N/A  . Number of Children: N/A  . Years of Education: N/A   Social History Main Topics  . Smoking status: Current Every Day Smoker -- 1.50 packs/day    Types: Cigarettes  . Smokeless tobacco: None  . Alcohol Use: Yes     Comment: occ  . Drug Use: Yes    Special: Marijuana  . Sexual Activity: No   Other Topics Concern  . None   Social History Narrative   Additional History:    Sleep:   Improved   Appetite:  Good   Assessment:   Musculoskeletal: Strength & Muscle Tone: within normal limits Gait & Station: normal Patient leans: N/A   Psychiatric Specialty Exam: Physical Exam   Review of Systems  HENT: Positive for congestion.   Respiratory: Positive for cough (hx of bronchitis).   Musculoskeletal: Positive for back pain (suspect this is due to mattress).  - denies chest pain, denies SOB, no nausea or vomiting , no tremors   Blood pressure 120/66, pulse  82, temperature 98.5 F (36.9 C), temperature source Oral, resp. rate 16, height 5' 8.5" (1.74 m), weight 123.832 kg (273 lb), SpO2 100 %.Body mass index is 40.9 kg/(m^2).  General Appearance: improved grooming  Eye Contact::  improved   Speech:  Normal Rate  Volume:  Normal  Mood:  better overall mood, no longer feeling irritable   Affect:   Appropriate, reactive   Thought Process:  Linear  Orientation:  Full (Time, Place, and Person)  Thought Content:  denies hallucinations, no delusions expressed, not internally preoccupied   Suicidal Thoughts:  No- at this time denies any plan or intention of  hurting self or of SI   Homicidal Thoughts:  No- at this time denies plan or intention of hurting anyone, to include any plan or intention of violence towards his boss/employer   Memory:  recent and remote grossly intact   Judgement:  Other:  improving  Insight:  improving   Psychomotor Activity:  Normal- no agitation or restlessness  Concentration:  Good  Recall:  Good  Fund of Knowledge:Good  Language: Good  Akathisia:  Negative  Handed:  Right  AIMS (if indicated):     Assets:  Communication Skills Desire for Improvement Resilience  ADL's:   Fair- improving    Cognition: WNL  Sleep:  Number of Hours: 6     Current Medications: Current Facility-Administered Medications  Medication Dose Route Frequency Provider Last Rate Last Dose  . acetaminophen (TYLENOL) tablet 650 mg  650 mg Oral Q4H PRN Delfin Gant, NP      . alum & mag hydroxide-simeth (MAALOX/MYLANTA) 200-200-20 MG/5ML suspension 30 mL  30 mL Oral PRN Delfin Gant, NP      . divalproex (DEPAKOTE ER) 24 hr tablet 500 mg  500 mg Oral QHS Jenne Campus, MD   500 mg at 05/20/15 2250  . FLUoxetine (PROZAC) capsule 20 mg  20 mg Oral QHS Jenne Campus, MD   20 mg at 05/20/15 2251  . hydrOXYzine (ATARAX/VISTARIL) tablet 25 mg  25 mg Oral Q6H PRN Jenne Campus, MD      . ibuprofen (ADVIL,MOTRIN) tablet 600 mg  600 mg Oral Q8H PRN Delfin Gant, NP      . lisinopril (PRINIVIL,ZESTRIL) tablet 10 mg  10 mg Oral Once Encarnacion Slates, NP   10 mg at 05/17/15 1828  . meloxicam (MOBIC) tablet 15 mg  15 mg Oral Daily Nanci Pina, FNP   15 mg at 05/21/15 0741  . nicotine (NICODERM CQ - dosed in mg/24 hours) patch 21 mg  21 mg Transdermal Daily Delfin Gant, NP   21 mg at 05/21/15 0745  . traZODone (DESYREL) tablet 75 mg  75 mg Oral QHS,MR X 1 Myer Peer Cobos, MD   75 mg at 05/20/15 2250    Lab Results:  No results found for this or any previous visit (from the past 48 hour(s)).  Physical  Findings: AIMS: Facial and Oral Movements Muscles of Facial Expression: None, normal Lips and Perioral Area: None, normal Jaw: None, normal Tongue: None, normal,Extremity Movements Upper (arms, wrists, hands, fingers): None, normal Lower (legs, knees, ankles, toes): None, normal, Trunk Movements Neck, shoulders, hips: None, normal, Overall Severity Severity of abnormal movements (highest score from questions above): None, normal Incapacitation due to abnormal movements: None, normal Patient's awareness of abnormal movements (rate only patient's report): No Awareness, Dental Status Current problems with teeth and/or dentures?: No Does patient usually wear dentures?: No  CIWA:    COWS:      Assessment -  Significant improvement compared to admission- less irritable, better related, less isolative. Tolerating Depakote ER and Prozac trial well, but prefers Prozac at HS as states it makes him feel sedated in AM. More future oriented and no longer ruminative about his job as he states he has decided to quit . Denies any HI. Mild hypertriglyceridemia.  Treatment Plan Summary: Daily contact with patient to assess and evaluate symptoms and progress in treatment, Medication management, Plan inpatient admission and medications as below  Continue Depakote ER 500 mgrs QHS for mood disorder- side effects discussed Continue Prozac 20 mgrs QDAY for depression - change to QHS to address report of sedation Continue  Trazodone 75  mgrs QHS PRN  for insomnia as needed  Continue Nicoderm to manage nicotine cravings Continue  Vistaril 25 mgrs Q 6 hours PRN for Anxiety as needed   Encourage group/ milieu  participation, to address coping skills, improve mood  Treatment team working on disposition options  Will request Dietary Consult for education related to diet improvement .    Priscille Loveless S  FNP-BC  05/21/2015, 2:53 PM  Reviewed the information documented and agree with the treatment  plan.  JONNALAGADDA,JANARDHAHA R. 05/23/2015 10:04 AM

## 2015-05-22 MED ORDER — GABAPENTIN 100 MG PO CAPS
100.0000 mg | ORAL_CAPSULE | Freq: Three times a day (TID) | ORAL | Status: DC
  Administered 2015-05-22 – 2015-05-23 (×3): 100 mg via ORAL
  Filled 2015-05-22 (×9): qty 1

## 2015-05-22 MED ORDER — GABAPENTIN 100 MG PO CAPS
100.0000 mg | ORAL_CAPSULE | Freq: Once | ORAL | Status: AC
  Administered 2015-05-22: 100 mg via ORAL

## 2015-05-22 MED ORDER — INFLUENZA VAC SPLIT QUAD 0.5 ML IM SUSY
0.5000 mL | PREFILLED_SYRINGE | INTRAMUSCULAR | Status: AC
  Administered 2015-05-23: 0.5 mL via INTRAMUSCULAR
  Filled 2015-05-22: qty 0.5

## 2015-05-22 NOTE — Progress Notes (Addendum)
Patient ID: Blake Petersen, male   DOB: Nov 26, 1990, 26 y.o.   MRN: [id number] [facility] MD Progress Note  05/22/2015 3:42 PM Blake Petersen  MRN:  [id number] Subjective:     Reports that he continues to feel depressed and anxious , particularly the latter . Denies medication side effects .   Although still depressed, he does state that his " anger and irritability" are greatly improved, and that he feels much more relaxed than he was prior to admission, which he attributes to Depakote trial.  Objective : I have reviewed case and have met with patient. As reviewed with staff patient has been improved compared to admission- he interacts appropriately in groups, and affect is noted to be reactive/ brighter  in these circumstances. Although not overtly disruptive on unit, has exhibited some behaviors that have required redirection from staff, such as providing a different name than his to some staff . Today patient reports feeling " pretty  Anxious and depressed " still . Has described his mood  and anxiety symptoms as 9/10 .  He describes passive SI, but contracts for safety on the unit .  He states he would not feel safe if discharged at this time. He is future oriented, however, and states  He needs to be discharged prior to the end of this week as he is expecting pay check. At this time he is planning on relocating to [facility] and going  To [employer] . Patient concerned about HgbA1C level ( 6. 2) , abnormal lipids. We reviewed importance of diet adjustment and weight loss . Patient reports he often eats out at [employer]. We reviewed importance of  Decreasing concentrated sugar foods, increasing vegetables .  He has also been seen by Dietitian for diet education and support . No medication side effects.     Principal Problem:  Bipolar Disorder, Depressed  Diagnosis:   Patient Active Problem List   Diagnosis Date Noted  . Bipolar affective disorder, current episode depressed [F31.30]  05/16/2015  . Bipolar disorder with depression [F31.30] 05/16/2015   Total Time spent with patient: 20 minutes   Past Medical History:  Past Medical History  Diagnosis Date  . Bipolar 1 disorder   . Asthma     Past Surgical History  Procedure Laterality Date  . Hand surgery    . Knee arthroscopy     Family History: History reviewed. No pertinent family history. Social History:  History  Alcohol Use  . Yes    Comment: occ     History  Drug Use  . Yes  . Special: Marijuana    Social History   Social History  . Marital Status: Single    Spouse Name: N/A  . Number of Children: N/A  . Years of Education: N/A   Social History Main Topics  . Smoking status: Current Every Day Smoker -- 1.50 packs/day    Types: Cigarettes  . Smokeless tobacco: None  . Alcohol Use: Yes     Comment: occ  . Drug Use: Yes    Special: Marijuana  . Sexual Activity: No   Other Topics Concern  . None   Social History Narrative   Additional History:    Sleep:   Improved   Appetite:  Good   Assessment:   Musculoskeletal: Strength & Muscle Tone: within normal limits Gait & Station: normal Patient leans: N/A   Psychiatric Specialty Exam: Physical Exam  Review of Systems  HENT: Positive for congestion.   Respiratory:  Positive for cough (hx of bronchitis).   Musculoskeletal: Positive for back pain (suspect this is due to mattress).  - denies chest pain, denies SOB, no nausea or vomiting , no tremors   Blood pressure 127/73, pulse 87, temperature 97.8 F (36.6 C), temperature source Oral, resp. rate 18, height 5' 8.5" (1.74 m), weight 273 lb (123.832 kg), SpO2 100 %.Body mass index is 40.9 kg/(m^2).  General Appearance: improved grooming  Eye Contact::  Good  Speech:  Normal Rate  Volume:  Normal  Mood:  States he still feels quite depressed   Affect:   Apprehensive about discharge planning, more reactive, brighter compared to admission, not irritable   Thought Process:   Linear  Orientation:  Full (Time, Place, and Person)  Thought Content:  denies hallucinations, no delusions expressed, not internally preoccupied - anxious about psychosocial stressors, ruminative about feeling unready for discharge at this time  Suicidal Thoughts:  No- at this time denies any thoughts of  hurting self or of SI.    Homicidal Thoughts:  No- at this time denies plan or intention of hurting anyone, to include any plan or intention of violence towards his boss/employer   Memory:  recent and remote grossly intact   Judgement:  Other:  improving  Insight:  improving   Psychomotor Activity:  Normal- no agitation or restlessness  Concentration:  Good  Recall:  Good  Fund of Knowledge:Good  Language: Good  Akathisia:  Negative  Handed:  Right  AIMS (if indicated):     Assets:  Communication Skills Desire for Improvement Resilience  ADL's:   Fair- improving    Cognition: WNL  Sleep:  Number of Hours: 5.75     Current Medications: Current Facility-Administered Medications  Medication Dose Route Frequency Provider Last Rate Last Dose  . acetaminophen (TYLENOL) tablet 650 mg  650 mg Oral Q4H PRN Delfin Gant, NP      . alum & mag hydroxide-simeth (MAALOX/MYLANTA) 200-200-20 MG/5ML suspension 30 mL  30 mL Oral PRN Delfin Gant, NP      . divalproex (DEPAKOTE ER) 24 hr tablet 500 mg  500 mg Oral QHS Jenne Campus, MD   500 mg at 05/21/15 2158  . FLUoxetine (PROZAC) capsule 20 mg  20 mg Oral QHS Jenne Campus, MD   20 mg at 05/21/15 2158  . gabapentin (NEURONTIN) capsule 100 mg  100 mg Oral TID Jenne Campus, MD      . hydrOXYzine (ATARAX/VISTARIL) tablet 25 mg  25 mg Oral Q6H PRN Jenne Campus, MD      . ibuprofen (ADVIL,MOTRIN) tablet 600 mg  600 mg Oral Q8H PRN Delfin Gant, NP      . lisinopril (PRINIVIL,ZESTRIL) tablet 10 mg  10 mg Oral Once Encarnacion Slates, NP   10 mg at 05/17/15 1828  . meloxicam (MOBIC) tablet 15 mg  15 mg Oral Daily Nanci Pina, FNP   15 mg at 05/22/15 0806  . nicotine (NICODERM CQ - dosed in mg/24 hours) patch 21 mg  21 mg Transdermal Daily Delfin Gant, NP   21 mg at 05/22/15 0806  . traZODone (DESYREL) tablet 75 mg  75 mg Oral QHS,MR X 1 Jenne Campus, MD   75 mg at 05/21/15 2158    Lab Results:  No results found for this or any previous visit (from the past 48 hour(s)).  Physical Findings: AIMS: Facial and Oral Movements Muscles of Facial Expression: None, normal Lips  and Perioral Area: None, normal Jaw: None, normal Tongue: None, normal,Extremity Movements Upper (arms, wrists, hands, fingers): None, normal Lower (legs, knees, ankles, toes): None, normal, Trunk Movements Neck, shoulders, hips: None, normal, Overall Severity Severity of abnormal movements (highest score from questions above): None, normal Incapacitation due to abnormal movements: None, normal Patient's awareness of abnormal movements (rate only patient's report): No Awareness, Dental Status Current problems with teeth and/or dentures?: No Does patient usually wear dentures?: No  CIWA:    COWS:      Assessment -  Patient presents improved compared to his admission presentation- he reports ongoing depression, and seems anxious +  apprehenisive about discharge, stating that he feels unready. He does present with much fuller range of affect, is no longer isolated, and is visible on unit, and he describes significant improvement /resolution of irritability on Depakote ER trial. He is future oriented, thinking of quitting his current job, relocating to [facility] after discharge and looking for another job there . He is tolerating medications well .   Treatment Plan Summary: Daily contact with patient to assess and evaluate symptoms and progress in treatment, Medication management, Plan inpatient admission and medications as below  Continue Depakote ER 500 mgrs QHS for mood disorder, explosiveness . Obtain Valproic Serum Level in  AM to monitor. Continue Prozac 20 mgrs QHS for depression  Start Neurontin 100 mgrs TID to address anxiety symptoms Continue  Trazodone 75  mgrs QHS PRN  for insomnia as needed  Continue Nicoderm to manage nicotine cravings Continue  Vistaril 25 mgrs Q 6 hours PRN for Anxiety as needed   Encourage group/ milieu  participation, to address coping skills, improve mood  Treatment team working on disposition options  .    Neita Garnet   MD  05/22/2015, 3:42 PM

## 2015-05-22 NOTE — [facility] Group Notes (Signed)
[facility] LCSW Group Therapy  05/22/2015 1:15pm  Type of Therapy:  Group Therapy vercoming Obstacles  Participation Level:  Minimal  Participation Quality:  Inattentive; Distracting at times  Affect:  Appropriate  Cognitive:  Appropriate and Oriented  Insight:  Limited  Engagement in Therapy:  Limited  Modes of Intervention:  Discussion, Exploration, Problem-solving and Support  Description of Group:   In this group patients will be encouraged to explore what they see as obstacles to their own wellness and recovery. They will be guided to discuss their thoughts, feelings, and behaviors related to these obstacles. The group will process together ways to cope with barriers, with attention given to specific choices patients can make. Each patient will be challenged to identify changes they are motivated to make in order to overcome their obstacles. This group will be process-oriented, with patients participating in exploration of their own experiences as well as giving and receiving support and challenge from other group members.  Summary of Patient Progress: Pt did not participate in group discussion but was observed to be distracting at times, engaging in side conversations and making joking remarks in response to other peers' comments.   Therapeutic Modalities:   Cognitive Behavioral Therapy Solution Focused Therapy Motivational Interviewing Relapse Prevention Therapy   Chad Cordial, LCSWA 05/22/2015 5:10 PM

## 2015-05-22 NOTE — Plan of Care (Signed)
Problem: Alteration in mood Goal: STG-Patient reports thoughts of self-harm to staff Outcome: Progressing Patient denies thoughts of self harm to this Clinical research associate.

## 2015-05-22 NOTE — Progress Notes (Signed)
Patient ID: Blake Petersen, male   DOB: June 06, 1991, 24 y.o.   MRN: [id number]  DAR: Pt. Denies SI/HI and A/V Hallucinations to this writer but rates his depression/hopelessness/anxiety 10/10 today. Patient is observed in the milieu interacting with his peers interacting sometimes inappropriately, using curse words, and is seen laughing and joking. Patient does continue to report pain in back and is receiving scheduled Mobic for it. Support and encouragement provided to the patient. Scheduled medications administered to patient per physician's orders. Patient continues to be intrusive and attention seeking at times. He reports his sleep last night was good, appetite is good, energy level is low, and concentration level is poor. Q15 minute checks are maintained for safety.

## 2015-05-22 NOTE — Progress Notes (Signed)
Recreation Therapy Notes  Date: 09.26.2016 Time: 9:30am Location: [address] Group Room   Group Topic: Stress Management  Goal Area(s) Addresses:  Patient will actively participate in stress management techniques presented during session.   Behavioral Response: Did not attend.   Grainne Knights L Skippy [place], LRT/CTRS        Karle Desrosier L 05/22/2015 1:19 PM 

## 2015-05-22 NOTE — Progress Notes (Signed)
Pt reports he had a good day.  Pt is noted to be loud and boisterous on the unit.  He tends to migrate where there is a conflict and attempt to become involved.  He can be intrusive and vulgar when interacting with his peers.  Pt was observed hanging around another pt during visitation when her boyfriend was on the unit.  Pt denies SI/HI/AVH at this time.  He still says his depression and anxiety is high.  His plan is to discharge to [facility] on [address] [place] or [facility].  He says his meds are helping.  Neurontin was added to his medication regime.  Pt attended group this evening.  Support and encouragement offered.  Pt makes his needs known to staff.  Safety maintained with q15 minute checks.

## 2015-05-22 NOTE — Progress Notes (Signed)
Nutrition Education Note  RD consulted for nutrition education regarding hyperlipidemia.  Lipid Panel     Component Value Date/Time   CHOL 196 05/19/2015 0640   TRIG 206* 05/19/2015 0640   HDL 26* 05/19/2015 0640   CHOLHDL 7.5 05/19/2015 0640   VLDL 41* 05/19/2015 0640   LDLCALC 129* 05/19/2015 0640    RD provided "High Triglycerides Nutrition Therapy" handout from the Academy of Nutrition and Dietetics. Reviewed patient's dietary recall. Provided examples on ways to decrease sugar and fat intake in diet. Discouraged intake of processed foods and consumption of sugar-sweetened beverages. Encouraged fresh fruits and vegetables as well as whole grain sources of carbohydrates to maximize fiber intake. Teach back method used.  Pt states that he was told his triglycerides were elevated but this was not explained to him in further detail and he requests RD explain it to him. Mentioned to him that MD had placed order stating pt was concerned about low HDL and wanting diet advise; pt reports he was never told about low HDL and also does not know what this means. Explained these terms to pt.   He states that PTA he typically skipped breakfast, ate two meals/day, ate mostly fast food-type foods, and drank soda and energy drinks "all day" to try to stay awake as he had a job which required driving frequently. He states he is supposed to start a new job when he leaves [facility] and that he plans to try to change diet then.   He also states that he was previously on medication for high cholesterol and he is wanting to re-start this medication.  Expect fair to poor compliance.  Body mass index is 40.9 kg/(m^2). Pt meets criteria for morbid obesity based on current BMI.  Diet Order:   Pt is also offered choice of unit snacks mid-morning and mid-afternoon.  Pt is eating as desired.   Labs and medications reviewed. No further nutrition interventions warranted at this time. If additional nutrition issues  arise, please re-consult RD.     Trenton Gammon, RD, LDN Inpatient Clinical Dietitian Pager # [phone] After hours/weekend pager # [phone]

## 2015-05-22 NOTE — [facility] Group Notes (Signed)
Adult Psychoeducational Group Note  Date:  05/22/2015 Time:  9:34 PM  Group Topic/Focus:  Wrap-Up Group:   The focus of this group is to help patients review their daily goal of treatment and discuss progress on daily workbooks.  Participation Level:  Active  Participation Quality:  Redirectable  Affect:  Appropriate  Cognitive:  Appropriate  Insight: Good  Engagement in Group:  Engaged  Modes of Intervention:  Discussion  Additional Comments:  Patient had to be redirected from using foul language and interrupting others.  Patient stated he slept most of the day.  He also expressed he had fun and his medications helped him cope.  His goal was to get better anxiety medication.  Caroll Rancher A 05/22/2015, 9:34 PM

## 2015-05-22 NOTE — [facility] Group Notes (Signed)
[facility] LCSW Aftercare Discharge Planning Group Note  05/22/2015 8:45 AM  Participation Quality: Alert, Appropriate and Oriented  Mood/Affect: Flat  Depression Rating: 10  Anxiety Rating: 10  Thoughts of Suicide: Pt denies SI/HI  Will you contract for safety? Yes  Current AVH: Pt denies  Plan for Discharge/Comments: Pt attended discharge planning group and actively participated in group. CSW discussed suicide prevention education with the group and encouraged them to discuss discharge planning and any relevant barriers. Pt reports that his medications are not working and his depression and anxiety have worsened over the weekend.  Transportation Means: Pt reports access to transportation  Supports: No supports mentioned at this time  Chad Cordial, LCSWA 05/22/2015 9:49 AM

## 2015-05-22 NOTE — Progress Notes (Signed)
Pt reports his day has been fine.  He initiated the conversation by saying that he was another patient on the unit.  He continued being silly while in conversation.  Pt denies SI/HI/AVH.  He is unsure of his discharge plans.  He later came to the med window asking to speak to the CN.  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  He had heard some of the other pts requesting to speak to the CN.  He said that one of the male pts in the dayroom needed to speak to the CN and he thought he needed to talk to the CN "since no one else was taking care of it".  Pt was told that the CN was aware of the situation, and that the other pt's issue would be address.  Pt went back to the dayroom mumbling under his breath.  Pt has been observed by staff talking with pts who are disgruntled and giving them advise, although he has had no issues of his own.  Pt took his hs meds without incident.  Support and encouragement offered.  Safety maintained with q15 minute checks.

## 2015-05-23 LAB — VALPROIC ACID LEVEL: Valproic Acid Lvl: 33 ug/mL — ABNORMAL LOW (ref 50.0–100.0)

## 2015-05-23 MED ORDER — GABAPENTIN 100 MG PO CAPS
200.0000 mg | ORAL_CAPSULE | Freq: Two times a day (BID) | ORAL | Status: DC
  Administered 2015-05-24: 200 mg via ORAL
  Filled 2015-05-23: qty 2
  Filled 2015-05-23: qty 28
  Filled 2015-05-23: qty 2
  Filled 2015-05-23: qty 28
  Filled 2015-05-23: qty 2

## 2015-05-23 MED ORDER — DIVALPROEX SODIUM ER 250 MG PO TB24
750.0000 mg | ORAL_TABLET | Freq: Every day | ORAL | Status: DC
  Administered 2015-05-23: 750 mg via ORAL
  Filled 2015-05-23: qty 3
  Filled 2015-05-23: qty 21
  Filled 2015-05-23: qty 3

## 2015-05-23 NOTE — Progress Notes (Signed)
Nursing Note: 0700-1900  D:  Mood is silly, affect is anxious and animated.  Pt found joking loudly in Day room most of shift. Reports to this RN, "I can't wait to get out of here, I am going to buy a dope red truck! I hope I still have that dudes card in my wallet."  "Gotta get back to work too."   A:  Encouraged to verbalize needs and concerns, active listening and support provided.  Continued Q 15 minute safety checks.  Observed active socialization in dayroom, but pt declined afternoon group stating, "That stuff is just too boring for me."  Pt to receive Flu vaccine this afternoon, he has requested that he get it after recreation in the gym.  R:  Pt. denies A/V hallucinations and is able to verbally contract for safety. States that he looks forward to discharge.

## 2015-05-23 NOTE — [facility] Group Notes (Addendum)
[facility] LCSW Group Therapy 05/23/2015 1:15 PM  Type of Therapy: Group Therapy- Feelings about Diagnosis  Pt came to group, stayed briefly and was distracting until he left. Did not return and stated to CSW after group, "Sorry I left, I got bored."  Chad Cordial, LCSWA 05/23/2015 5:14 PM

## 2015-05-23 NOTE — Progress Notes (Signed)
Patient ID: Blake Petersen, male   DOB: 1990-10-25, 24 y.o.   MRN: [id number] D: Client visible on the unit, in day room interacting with peers. Client irritated with another staff because he asked him about discharge plans "that's none of his business, I lost it" "I don't want nobody pressing me about my business. Client reports his day has gone well up until this time. A: Writer reviewed medications, administered as prescribed, encouraged client to use positive coping skills. Staff will monitor q69min for safety. R: Client is safe on the unit, attended group.

## 2015-05-23 NOTE — Progress Notes (Signed)
[facility] Group Notes:  (Nursing/MHT/Case Management/Adjunct)  Date:  05/23/2015  Time:  10:17 AM  Type of Therapy:  Psychoeducational Skills  Participation Level:  Active  Participation Quality:  Monopolizing and Sharing  Affect:  Defensive and Irritable  Cognitive:  Appropriate  Insight:  Limited  Engagement in Group:  Lacking and Monopolizing  Modes of Intervention:  Clarification, Limit-setting and Problem-solving  Summary of Progress/Problems: Pt. Was distracting an inappropriate needed redirection.    Donell Beers 05/23/2015, 10:17 AM

## 2015-05-23 NOTE — Progress Notes (Signed)
Patient ID: Blake Petersen, male   DOB: 1991-04-15, 24 y.o.   MRN: [id number] [facility] MD Progress Note  05/23/2015 3:22 PM Blake Petersen  MRN:  [id number] Subjective:    He reports improvement and at this time is more focused on discharge planning issues . He states he has been told by a friend he can go live with him after discharge. States this is a  Better plan for him than to go to [employer], as had been his original plan, because it will allow him to find another job sooner, and be able to see his children, who are currently with family in [place], more often. 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 Denies medication side effects.   Objective : I have reviewed case and have met with patient. Patient improved compared to admission- mood improved , affect appropriate. At this time no pressured speech, no grandiose ideations, and no overt irritability or expansive affect. Thought process is well organized, no flight of ideations. Staff reports patient can sometimes be disruptive in milieu, due to some intrusiveness, tendency to use foul language at times. He is redirectable. As noted, said presentation does not appear to be in the context of mania at this time and there are no current symptoms of mania or hypomania noted .  At this time no longer experiencing any suicidal ideations and focused on being discharged soon so he can find a new job. Of note, Valproic Serum level is 33 - sub therapeutic- no side effects from psychiatric medications. We discussed options and he agrees with further titration . He reports Depakote has helped " a lot", and has helped him not feel irritable .     Principal Problem:  Bipolar Disorder, Depressed  Diagnosis:   Patient Active Problem List   Diagnosis Date Noted  . Bipolar affective disorder, current episode depressed [F31.30] 05/16/2015  .  Bipolar disorder with depression [F31.30] 05/16/2015   Total Time spent with patient: 20 minutes   Past Medical History:  Past Medical History  Diagnosis Date  . Bipolar 1 disorder   . Asthma     Past Surgical History  Procedure Laterality Date  . Hand surgery    . Knee arthroscopy     Family History: History reviewed. No pertinent family history. Social History:  History  Alcohol Use  . Yes    Comment: occ     History  Drug Use  . Yes  . Special: Marijuana    Social History   Social History  . Marital Status: Single    Spouse Name: N/A  . Number of Children: N/A  . Years of Education: N/A   Social History Main Topics  . Smoking status: Current Every Day Smoker -- 1.50 packs/day    Types: Cigarettes  . Smokeless tobacco: None  . Alcohol Use: Yes     Comment: occ  . Drug Use: Yes    Special: Marijuana  . Sexual Activity: No   Other Topics Concern  . None   Social History Narrative   Additional History:    Sleep:   Improved   Appetite:  Good   Assessment:   Musculoskeletal: Strength & Muscle Tone: within normal limits Gait & Station: normal Patient leans: N/A   Psychiatric Specialty Exam: Physical Exam  Review of Systems  HENT: Positive for congestion.  Respiratory: Positive for cough (hx of bronchitis).   Musculoskeletal: Positive for back pain (suspect this is due to mattress).  - denies chest pain, denies SOB, no nausea or vomiting , no tremors   Blood pressure 139/93, pulse 71, temperature 97.7 F (36.5 C), temperature source Oral, resp. rate 20, height 5' 8.5" (1.74 m), weight 273 lb (123.832 kg), SpO2 100 %.Body mass index is 40.9 kg/(m^2).  General Appearance: improved grooming  Eye Contact::  Good  Speech:  Normal Rate  Volume:  Normal  Mood:   Better, not  depressed , today seems euthymic   Affect: fuller in range, not irritable or expansive at this time  Thought Process:  Linear  Orientation:  Full (Time, Place, and Person)   Thought Content:  denies hallucinations, no delusions expressed, not internally preoccupied -  At this time future oriented, and more optimistic about discharge   Suicidal Thoughts:  No- at this time denies any thoughts of  hurting self or of SI.    Homicidal Thoughts:  No- at this time denies plan or intention of hurting anyone, to include any plan or intention of violence towards his boss/employer   Memory:  recent and remote grossly intact   Judgement:  Other:  improving  Insight:  improving   Psychomotor Activity:  Normal- no agitation or restlessness  Concentration:  Good  Recall:  Good  Fund of Knowledge:Good  Language: Good  Akathisia:  Negative  Handed:  Right  AIMS (if indicated):     Assets:  Communication Skills Desire for Improvement Resilience  ADL's:   Fair- improving    Cognition: WNL  Sleep:  Number of Hours: 5.75     Current Medications: Current Facility-Administered Medications  Medication Dose Route Frequency Provider Last Rate Last Dose  . acetaminophen (TYLENOL) tablet 650 mg  650 mg Oral Q4H PRN Delfin Gant, NP      . alum & mag hydroxide-simeth (MAALOX/MYLANTA) 200-200-20 MG/5ML suspension 30 mL  30 mL Oral PRN Delfin Gant, NP      . divalproex (DEPAKOTE ER) 24 hr tablet 500 mg  500 mg Oral QHS Jenne Campus, MD   500 mg at 05/22/15 2145  . FLUoxetine (PROZAC) capsule 20 mg  20 mg Oral QHS Jenne Campus, MD   20 mg at 05/22/15 2145  . [START ON 05/24/2015] gabapentin (NEURONTIN) capsule 200 mg  200 mg Oral BID Myer Peer Cobos, MD      . hydrOXYzine (ATARAX/VISTARIL) tablet 25 mg  25 mg Oral Q6H PRN Myer Peer Cobos, MD      . ibuprofen (ADVIL,MOTRIN) tablet 600 mg  600 mg Oral Q8H PRN Delfin Gant, NP      . Influenza vac split quadrivalent PF (FLUARIX) injection 0.5 mL  0.5 mL Intramuscular Tomorrow-1000 Fernando A Cobos, MD      . lisinopril (PRINIVIL,ZESTRIL) tablet 10 mg  10 mg Oral Once Encarnacion Slates, NP   10 mg at 05/17/15  1828  . meloxicam (MOBIC) tablet 15 mg  15 mg Oral Daily Nanci Pina, FNP   15 mg at 05/23/15 7614  . nicotine (NICODERM CQ - dosed in mg/24 hours) patch 21 mg  21 mg Transdermal Daily Delfin Gant, NP   21 mg at 05/23/15 7092  . traZODone (DESYREL) tablet 75 mg  75 mg Oral QHS,MR X 1 Jenne Campus, MD   75 mg at 05/22/15 2234    Lab Results:  Results for orders placed  or performed during the hospital encounter of 05/16/15 (from the past 48 hour(s))  Valproic acid level     Status: Abnormal   Collection Time: 05/23/15  6:40 AM  Result Value Ref Range   Valproic Acid Lvl 33 (L) 50.0 - 100.0 ug/mL    Comment: Performed at [facility]    Physical Findings: AIMS: Facial and Oral Movements Muscles of Facial Expression: None, normal Lips and Perioral Area: None, normal Jaw: None, normal Tongue: None, normal,Extremity Movements Upper (arms, wrists, hands, fingers): None, normal Lower (legs, knees, ankles, toes): None, normal, Trunk Movements Neck, shoulders, hips: None, normal, Overall Severity Severity of abnormal movements (highest score from questions above): None, normal Incapacitation due to abnormal movements: None, normal Patient's awareness of abnormal movements (rate only patient's report): No Awareness, Dental Status Current problems with teeth and/or dentures?: No Does patient usually wear dentures?: No  CIWA:    COWS:      Assessment -  He continues to improve- at this time describes mood as better and today seems euthymic. He is not presenting with symptoms of mania. Behavioral issues on unit not felt to be related to hypomania at this time.  He is more future oriented and clearly less apprehensive about discharge planning . He states he has been told by a friend he can live with him for a period of several weeks " while I get a job and get back on my feet ".  Feels Depakote has helped significantly- no side effects- level sub-therapeutic at 33.    .   Treatment Plan Summary: Daily contact with patient to assess and evaluate symptoms and progress in treatment, Medication management, Plan inpatient admission and medications as below  Increase Depakote ER to 750 mgrs QHS for mood disorder, explosiveness . ( titration related to sub-threapeutic serum level, prefer slow titration to minimize risk of side effects,, as current dose well tolerated and has been effective )  Continue Prozac 20 mgrs QHS for depression  Increase Neurontin  To 200 mgrs BID  to address anxiety symptoms Continue  Trazodone 75  mgrs QHS PRN  for insomnia as needed  Continue Nicoderm to manage nicotine cravings Continue  Vistaril 25 mgrs Q 6 hours PRN for Anxiety as needed   Encourage group/ milieu  participation, to address coping skills, improve mood  Treatment team working on disposition options - as noted, patient more focused on disposition planning at this time .    Neita Garnet   MD  05/23/2015, 3:22 PM

## 2015-05-24 MED ORDER — HYDROXYZINE HCL 25 MG PO TABS: 25.0000 mg | ORAL_TABLET | Freq: Four times a day (QID) | ORAL | Status: DC | PRN

## 2015-05-24 MED ORDER — NICOTINE 21 MG/24HR TD PT24: 21.0000 mg | MEDICATED_PATCH | Freq: Every day | TRANSDERMAL | Status: DC

## 2015-05-24 MED ORDER — MELOXICAM 15 MG PO TABS: 15.0000 mg | ORAL_TABLET | Freq: Every day | ORAL | Status: DC

## 2015-05-24 MED ORDER — TRAZODONE HCL 150 MG PO TABS
75.0000 mg | ORAL_TABLET | Freq: Every evening | ORAL | Status: DC | PRN
  Filled 2015-05-24 (×2): qty 7

## 2015-05-24 MED ORDER — TRAZODONE HCL 150 MG PO TABS: 75.0000 mg | ORAL_TABLET | Freq: Every evening | ORAL | Status: DC | PRN

## 2015-05-24 MED ORDER — DIVALPROEX SODIUM ER 250 MG PO TB24: 750.0000 mg | ORAL_TABLET | Freq: Every day | ORAL | Status: DC

## 2015-05-24 MED ORDER — FLUOXETINE HCL 20 MG PO CAPS: 20.0000 mg | ORAL_CAPSULE | Freq: Every day | ORAL | Status: DC

## 2015-05-24 MED ORDER — LISINOPRIL 10 MG PO TABS: 10.0000 mg | ORAL_TABLET | Freq: Once | ORAL | Status: DC

## 2015-05-24 MED ORDER — GABAPENTIN 100 MG PO CAPS: 200.0000 mg | ORAL_CAPSULE | Freq: Two times a day (BID) | ORAL | Status: DC

## 2015-05-24 NOTE — Progress Notes (Signed)
  [facility] Adult Case Management Discharge Plan :  Will you be returning to the same living situation after discharge:  No. Pt discharging to friend's home At discharge, do you have transportation home?: Yes,  pt arranged his own transportation Do you have the ability to pay for your medications: Yes,  Pt provided with prescriptions  Release of information consent forms completed and in the chart;  Patient's signature needed at discharge.  Patient to Follow up at: Follow-up Information    Follow up with [facility] On 05/29/2015.   Specialty:  Behavioral Health   Why:  Please walk-in between 8am-3pm Monday-Friday to be seen by a doctor and therapist. It is advised that you arrive as early as possible to avoid long wait times. Inform staff there that you are there for a hospital discharge appointment.   Contact information:   [address] [place] [address] [phone]       Follow up with [facility]     Today.   Why:  Call them to set upa n appointment to check your A1C for possible diabetes evaluation.    Contact information:   [address] [place] [address] [phone]      Patient denies SI/HI: Yes,  Pt denies    Safety Planning and Suicide Prevention discussed: Yes,  with Pt; declines family contact  Have you used any form of tobacco in the last 30 days? (Cigarettes, Smokeless Tobacco, Cigars, and/or Pipes): Yes  Has patient been referred to the Quitline?: Yes, faxed on 05/24/15  Elaina Hoops 05/24/2015, 11:21 AM

## 2015-05-24 NOTE — Tx Team (Addendum)
Interdisciplinary Treatment Plan Update (Adult)  Date:  05/23/2015   Time Reviewed:  11:16 AM   Progress in Treatment: Attending groups: Yes Participating in groups:  Minimally and inappropriately Taking medication as prescribed:  Yes. Tolerating medication:  Yes. Family/Significant othe contact made:  No, Pt declines family contact Patient understands diagnosis:  Yes  As evidenced by seeking help with depression Discussing patient identified problems/goals with staff:  Yes, see initial care plan. Medical problems stabilized or resolved:  Yes. Denies suicidal/homicidal ideation: Yes. Issues/concerns per patient self-inventory:  No. Other:  New problem(s) identified:  Discharge Plan or Barriers:  Pt will DC to friend's home and follow up with [facility]  Reason for Continuation of Hospitalization: Anxiety Depression Medication stabilization  Comments:  Per Pt, here with [employer] voluntarily for Suicidal Ideation. Pt states it has been going on for a while. Pt states he has been off of his meds x 2 months. Pt states he would run into traffic.  Prozac, Tegretol trail  Estimated length of stay: 0 days; pt stable for DC today  New goal(s):  Review of initial/current patient goals per problem list:   Review of initial/current patient goals per problem list:  1. Goal(s): Patient will participate in aftercare plan   Met: Yes   Target date: 3-5 days post admission date   As evidenced by: Patient will participate within aftercare plan AEB aftercare provider and housing plan at discharge being identified.  05/17/15:  Return home, follow up outpt   2. Goal (s): Patient will exhibit decreased depressive symptoms and suicidal ideations.   Met: Yes   Target date: 3-5 days post admission date   As evidenced by: Patient will utilize self rating of depression at 3 or below and demonstrate decreased signs of depression or be deemed stable for discharge by MD. 05/17/15  Rates  depression at a 7 today. 05/23/15: Pt rates depression at 0/10; denies SI    3. Goal(s): Patient will demonstrate decreased signs and symptoms of anxiety.   Met: Yes   Target date: 3-5 days post admission date   As evidenced by: Patient will utilize self rating of anxiety at 3 or below and demonstrated decreased signs of anxiety, or be deemed stable for discharge by MD 05/17/15  Rates anxiety at a 7 today 05/23/15: Rates anxiety at 0/10.     Attendees: Patient:    Family:     Physician:  Neita Garnet, MD 05/23/15 11:17 AM   Nursing:   Marnee Guarneri, RN 05/23/15 11:17 AM   CSW:    Peri Maris, LCSWA  05/23/15 11:17 AM   Other:    Other:     Other:  Lars Pinks, Nurse CM 05/23/15 11:17 AM   Other:  Lucinda Dell, Beverly Sessions TCT 05/23/15 11:17 AM   Other:  Norberto Sorenson, Leland  05/23/15 11:17 AM   Other:    Other:    Other:    Other:    Other:    Other:      Scribe for Treatment Team:   Roque Lias B, 05/24/2015 11:16 AM

## 2015-05-24 NOTE — [facility] Group Notes (Signed)
Pt attended group tonight. Pt stated he had a great day overall until his night Nurse caused him to become upset. Pt asked questions to ensure that he would not be in violation of going home tomorrow. Pt did not require anything else from me. Deatra James, MHT/NS

## 2015-05-24 NOTE — Discharge Summary (Signed)
Physician Discharge Summary Note  Patient:  Blake Petersen is an 24 y.o., male MRN:  [id number] DOB:  [birth year] Patient phone:  [phone] (home)  Patient address:   [address] [place] [address],  Total Time spent with patient: 45 minutes  Date of Admission:  05/16/2015 Date of Discharge: 05/24/15  Reason for Admission:   History of Present Illness: Samer is a 24 year old AA male. Admitted to [facility] from the [facility] with complaints of insomnia, anger issues & snapping at people. He reports, "I went to the hospital on Tuesday. I have been off of my medicines for mental health. I ran out of them 3 months ago & I do not have an outpatient provider. I can't remember the names of the medications. I know I was taking them for depression & Bipolar disorder. I was doing well on them. After running out of my medicines, my anger issues flared up & worsened. I was snapping at everyone. I was not using any substances. I'm feeling suicidal, but safe here. I have a long familial Hx of mental illnesses"  Principal Problem: Bipolar affective disorder, current episode depressed Discharge Diagnoses: Patient Active Problem List   Diagnosis Date Noted  . Bipolar affective disorder, current episode depressed [F31.30] 05/16/2015    Priority: High    Musculoskeletal: Strength & Muscle Tone: within normal limits Gait & Station: normal Patient leans: N/A  Psychiatric Specialty Exam: Physical Exam  Review of Systems  Psychiatric/Behavioral: Positive for depression. Negative for suicidal ideas, hallucinations and substance abuse. The patient is nervous/anxious and has insomnia.   All other systems reviewed and are negative.   Blood pressure 129/90, pulse 72, temperature 97.9 F (36.6 C), temperature source Oral, resp. rate 18, height 5' 8.5" (1.74 m), weight 123.832 kg (273 lb), SpO2 100 %.Body mass index is 40.9 kg/(m^2).  SEE MD PSE within the [facility]   Have you used any form of tobacco in  the last 30 days? (Cigarettes, Smokeless Tobacco, Cigars, and/or Pipes): Yes  Has this patient used any form of tobacco in the last 30 days? (Cigarettes, Smokeless Tobacco, Cigars, and/or Pipes) Yes, A prescription for an FDA-approved tobacco cessation medication was offered at discharge and the patient accepted.   Past Medical History:  Past Medical History  Diagnosis Date  . Bipolar 1 disorder   . Asthma     Past Surgical History  Procedure Laterality Date  . Hand surgery    . Knee arthroscopy     Family History: History reviewed. No pertinent family history. Social History:  History  Alcohol Use  . Yes    Comment: occ     History  Drug Use  . Yes  . Special: Marijuana    Social History   Social History  . Marital Status: Single    Spouse Name: N/A  . Number of Children: N/A  . Years of Education: N/A   Social History Main Topics  . Smoking status: Current Every Day Smoker -- 1.50 packs/day    Types: Cigarettes  . Smokeless tobacco: None  . Alcohol Use: Yes     Comment: occ  . Drug Use: Yes    Special: Marijuana  . Sexual Activity: No   Other Topics Concern  . None   Social History Narrative    Risk to Self: Is patient at risk for suicide?: Yes What has been your use of drugs/alcohol within the last 12 months?: alcohol, beer on weekends, ecstasy periodically-last use was 2 weeks  ago Risk to Others:   Prior Inpatient Therapy:   Prior Outpatient Therapy:    Level of Care:  OP  Hospital Course:   Joon Gonnella was admitted for Bipolar affective disorder, current episode depressed, and crisis management.  Pt was treated discharged with the medications listed below under Medication List.  Medical problems were identified and treated as needed.  Home medications were restarted as appropriate.  Improvement was monitored by observation and Camarion Leamy 's daily report of symptom reduction.  Emotional and mental status was monitored by daily self-inventory reports  completed by [facility] and clinical staff.         Grantland Inscoe was evaluated by the treatment team for stability and plans for continued recovery upon discharge. Stillman Michel 's motivation was an integral factor for scheduling further treatment. Employment, transportation, bed availability, health status, family support, and any pending legal issues were also considered during hospital stay. Pt was offered further treatment options upon discharge including but not limited to Residential, Intensive Outpatient, and Outpatient treatment.  Vincenzo Montesinos will follow up with the services as listed below under Follow Up Information.     Upon completion of this admission the patient was both mentally and medically stable for discharge denying suicidal/homicidal ideation, auditory/visual/tactile hallucinations, delusional thoughts and paranoia.    Consults:  None  Significant Diagnostic Studies:  BAL and UDS negative, CMP pertinent for Triglycerides 206, HDL 26, LDL 129, VLDL 41. Valproic acid 33, A1C is 6.2  Discharge Vitals:   Blood pressure 129/90, pulse 72, temperature 97.9 F (36.6 C), temperature source Oral, resp. rate 18, height 5' 8.5" (1.74 m), weight 123.832 kg (273 lb), SpO2 100 %. Body mass index is 40.9 kg/(m^2). Lab Results:   Results for orders placed or performed during the hospital encounter of 05/16/15 (from the past 72 hour(s))  Valproic acid level     Status: Abnormal   Collection Time: 05/23/15  6:40 AM  Result Value Ref Range   Valproic Acid Lvl 33 (L) 50.0 - 100.0 ug/mL    Comment: Performed at [facility]    Physical Findings: AIMS: Facial and Oral Movements Muscles of Facial Expression: None, normal Lips and Perioral Area: None, normal Jaw: None, normal Tongue: None, normal,Extremity Movements Upper (arms, wrists, hands, fingers): None, normal Lower (legs, knees, ankles, toes): None, normal, Trunk Movements Neck, shoulders, hips: None, normal,  Overall Severity Severity of abnormal movements (highest score from questions above): None, normal Incapacitation due to abnormal movements: None, normal Patient's awareness of abnormal movements (rate only patient's report): No Awareness, Dental Status Current problems with teeth and/or dentures?: No Does patient usually wear dentures?: No  CIWA:    COWS:      See Psychiatric Specialty Exam and Suicide Risk Assessment completed by Attending Physician prior to discharge.  Discharge destination:  Home  Is patient on multiple antipsychotic therapies at discharge:  No   Has Patient had three or more failed trials of antipsychotic monotherapy by history:  No    Recommended Plan for Multiple Antipsychotic Therapies: NA     Medication List    TAKE these medications      Indication   divalproex 250 MG 24 hr tablet  Commonly known as:  DEPAKOTE ER  Take 3 tablets (750 mg total) by mouth at bedtime.   Indication:  mood stabilization     FLUoxetine 20 MG capsule  Commonly known as:  PROZAC  Take 1 capsule (20 mg total) by mouth at  bedtime.   Indication:  Depression, Major Depressive Disorder     gabapentin 100 MG capsule  Commonly known as:  NEURONTIN  Take 2 capsules (200 mg total) by mouth 2 (two) times daily.   Indication:  mood stabilization     hydrOXYzine 25 MG tablet  Commonly known as:  ATARAX/VISTARIL  Take 1 tablet (25 mg total) by mouth every 6 (six) hours as needed for anxiety.   Indication:  Anxiety Neurosis     lisinopril 10 MG tablet  Commonly known as:  PRINIVIL,ZESTRIL  Take 1 tablet (10 mg total) by mouth once.   Indication:  High Blood Pressure     meloxicam 15 MG tablet  Commonly known as:  MOBIC  Take 1 tablet (15 mg total) by mouth daily.   Indication:  myalgias, joint pain     nicotine 21 mg/24hr patch  Commonly known as:  NICODERM CQ - dosed in mg/24 hours  Place 1 patch (21 mg total) onto the skin daily.   Indication:  Nicotine Addiction      traZODone 150 MG tablet  Commonly known as:  DESYREL  Take 0.5 tablets (75 mg total) by mouth at bedtime and may repeat dose one time if needed.   Indication:  Trouble Sleeping           Follow-up Information    Follow up with [facility] On 05/29/2015.   Why:  This is a walk-in appointment for your initial assessment. The clinic opens at 9am. Please bring proof that you are living at the rescue mission along with your medications/discharge paperwork from the hospital.   Contact information:   [address] [place] [address] [phone] Fax: [phone]      Follow-up recommendations:  Activity:  As tolerated Diet:  heart healthy with low sodium  See PCP for follow-up about your A1C at 6.2 to rule in/out diabetes  Comments:   Take all medications as prescribed. Keep all follow-up appointments as scheduled.  Do not consume alcohol or use illegal drugs while on prescription medications. Report any adverse effects from your medications to your primary care provider promptly.  In the event of recurrent symptoms or worsening symptoms, call 911, a crisis hotline, or go to the nearest emergency department for evaluation.   Total Discharge Time: Greater than 30 minutes  Signed: Beau Fanny, FNP-BC 05/24/2015, 10:15 AM   Patient seen, Suicide Assessment Completed.  Disposition Plan Reviewed

## 2015-05-24 NOTE — Progress Notes (Signed)
Staff became aware that Pt was arranging new living situation with other patients on the unit by renting a house offered by another fellow patient. [facility] staff, including writer, advised Pt of the risk associated with this discharge plan. Pt reported her wish to continue to pursue the plan despite staff's warnings.    Lauren Carter, LCSWA Clinical Social Work [phone]    

## 2015-05-24 NOTE — [facility] Suicide Risk Assessment (Signed)
[facility] Discharge Suicide Risk Assessment   Demographic Factors:  24 year old male, has three children,  Recently employed   Total Time spent with patient: 30 minutes  Musculoskeletal: Strength & Muscle Tone: within normal limits Gait & Station: normal Patient leans: N/A  Psychiatric Specialty Exam: Physical Exam  ROS  Blood pressure 129/90, pulse 72, temperature 97.9 F (36.6 C), temperature source Oral, resp. rate 18, height 5' 8.5" (1.74 m), weight 273 lb (123.832 kg), SpO2 100 %.Body mass index is 40.9 kg/(m^2).  General Appearance: improved grooming   Eye Contact::  Good  Speech:  Normal Rate409  Volume:  Normal  Mood:  improved, denies depression  Affect:  Appropriate  Thought Process:  Linear  Orientation:  Full (Time, Place, and Person)  Thought Content:  denies hallucinations, no delusions  Suicidal Thoughts:  No- denies any thoughts of hurting self   Homicidal Thoughts:  No- denies any thoughts of hurting anyone else, specifically denies any thoughts of hurting prior employer/ boss   Memory:  recent and remote grossly intact   Judgement:  Other:  improved   Insight:  improved   Psychomotor Activity:  Normal  Concentration:  Good  Recall:  Good  Fund of Knowledge:Good  Language: Good  Akathisia:  Negative  Handed:  Right  AIMS (if indicated):     Assets:  Communication Skills Desire for Improvement Physical Health Resilience  Sleep:  Number of Hours: 6.25  Cognition: WNL  ADL's:  Intact   Have you used any form of tobacco in the last 30 days? (Cigarettes, Smokeless Tobacco, Cigars, and/or Pipes): Yes  Has this patient used any form of tobacco in the last 30 days? (Cigarettes, Smokeless Tobacco, Cigars, and/or Pipes) Yes, A prescription for an FDA-approved tobacco cessation medication was offered at discharge and the patient refused  Mental Status Per Nursing Assessment::   On Admission:     Current Mental Status by Physician: At this time patient is much  improved compared to admission- mood is better and currently not feeling depressed, affect is brighter, no thought disorder, no SI or HI, no psychotic symptoms and future oriented, states a friend has helped him find a place to stay " until I can get a job and get my own place ". States he has a job interview coming up and is hoping to be working as of next week.   Loss Factors: Children currently out of state, stress related to prior job- " travelling all the time, never time for the family"  Historical Factors: Prior psychiatric admission  2-3 years ago " to get medications adjusted ".  Suicide attempt as a teenager .   Risk Reduction Factors:   Responsible for children under 59 years of age, Sense of responsibility to family, Positive social support and Positive coping skills or problem solving skills  Continued Clinical Symptoms:  As noted, currently significantly improved compared to admission - no SI or HI, no psychotic symptoms  Cognitive Features That Contribute To Risk:  No gross cognitive deficits noted upon discharge. Is alert , attentive, and oriented x 3   Suicide Risk:  Mild:  Suicidal ideation of limited frequency, intensity, duration, and specificity.  There are no identifiable plans, no associated intent, mild dysphoria and related symptoms, good self-control (both objective and subjective assessment), few other risk factors, and identifiable protective factors, including available and accessible social support.  Principal Problem: Bipolar affective disorder, current episode depressed Discharge Diagnoses:  Patient Active Problem List   Diagnosis  Date Noted  . Bipolar affective disorder, current episode depressed [F31.30] 05/16/2015    Follow-up Information    Follow up with [facility] On 05/29/2015.   Specialty:  Behavioral Health   Why:  Please walk-in between 8am-3pm Monday-Friday to be seen by a doctor and therapist. It is advised that you arrive as early as possible to  avoid long wait times. Inform staff there that you are there for a hospital discharge appointment.   Contact information:   [address] [place] [address] [phone]       Follow up with [facility]     Today.   Why:  Call them to set upa n appointment to check your A1C for possible diabetes evaluation.    Contact information:   [address] [place] [address] [phone]      Plan Of Care/Follow-up recommendations:  Activity:  as tolerated  Diet:  Regular  Tests:  NA Other:  see below   Is patient on multiple antipsychotic therapies at discharge:  No   Has Patient had three or more failed trials of antipsychotic monotherapy by history:  No  Recommended Plan for Multiple Antipsychotic Therapies: NA  Patient is leaving in good spirits . He plans to live with a friend until he can get a place of his own. Plans to follow up as above .    COBOS, FERNANDO 05/24/2015, 12:01 PM

## 2015-05-24 NOTE — Progress Notes (Signed)
Patient ID: Blake Petersen, male   DOB: Nov 15, 1990, 24 y.o.   MRN: [id number]  Pt. Denies SI/HI and A/V hallucinations. Belongings returned to patient at time of discharge. Patient denies any pain or discomfort. Discharge instructions and medications were reviewed with patient. Patient verbalized understanding of both medications and discharge instructions. Patient discharged to lobby with no apparent distress. Q15 minute safety checks maintained until discharge.

## 2015-07-19 ENCOUNTER — Encounter (HOSPITAL_COMMUNITY): Payer: Self-pay | Admitting: Emergency Medicine

## 2015-07-19 ENCOUNTER — Emergency Department (HOSPITAL_COMMUNITY): Payer: Self-pay

## 2015-07-19 ENCOUNTER — Emergency Department (HOSPITAL_COMMUNITY)
Admission: EM | Admit: 2015-07-19 | Discharge: 2015-07-21 | Disposition: A | Discharge status: Other Acute Inpatient Hospital | Payer: Self-pay | Attending: Emergency Medicine | Admitting: Emergency Medicine

## 2015-07-19 DIAGNOSIS — X838XXA Intentional self-harm by other specified means, initial encounter: Secondary | ICD-10-CM | POA: Insufficient documentation

## 2015-07-19 DIAGNOSIS — Z79899 Other long term (current) drug therapy: Secondary | ICD-10-CM | POA: Insufficient documentation

## 2015-07-19 DIAGNOSIS — Y9389 Activity, other specified: Secondary | ICD-10-CM | POA: Insufficient documentation

## 2015-07-19 DIAGNOSIS — J45909 Unspecified asthma, uncomplicated: Secondary | ICD-10-CM | POA: Insufficient documentation

## 2015-07-19 DIAGNOSIS — F17210 Nicotine dependence, cigarettes, uncomplicated: Secondary | ICD-10-CM | POA: Insufficient documentation

## 2015-07-19 DIAGNOSIS — Y998 Other external cause status: Secondary | ICD-10-CM | POA: Insufficient documentation

## 2015-07-19 DIAGNOSIS — R45850 Homicidal ideations: Secondary | ICD-10-CM | POA: Insufficient documentation

## 2015-07-19 DIAGNOSIS — F314 Bipolar disorder, current episode depressed, severe, without psychotic features: Secondary | ICD-10-CM | POA: Insufficient documentation

## 2015-07-19 DIAGNOSIS — Y9289 Other specified places as the place of occurrence of the external cause: Secondary | ICD-10-CM | POA: Insufficient documentation

## 2015-07-19 DIAGNOSIS — F3130 Bipolar disorder, current episode depressed, mild or moderate severity, unspecified: Secondary | ICD-10-CM | POA: Diagnosis present

## 2015-07-19 DIAGNOSIS — S6991XA Unspecified injury of right wrist, hand and finger(s), initial encounter: Secondary | ICD-10-CM | POA: Insufficient documentation

## 2015-07-19 DIAGNOSIS — Z791 Long term (current) use of non-steroidal anti-inflammatories (NSAID): Secondary | ICD-10-CM | POA: Insufficient documentation

## 2015-07-19 LAB — COMPREHENSIVE METABOLIC PANEL
ALBUMIN: 3.8 g/dL (ref 3.5–5.0)
ALT: 24 U/L (ref 17–63)
ANION GAP: 8 (ref 5–15)
AST: 23 U/L (ref 15–41)
Alkaline Phosphatase: 65 U/L (ref 38–126)
BILIRUBIN TOTAL: 0.3 mg/dL (ref 0.3–1.2)
BUN: 12 mg/dL (ref 6–20)
CALCIUM: 9.2 mg/dL (ref 8.9–10.3)
CO2: 22 mmol/L (ref 22–32)
Chloride: 109 mmol/L (ref 101–111)
Creatinine, Ser: 0.78 mg/dL (ref 0.61–1.24)
GFR calc non Af Amer: >60 mL/min (ref >60)
GLUCOSE: 97 mg/dL (ref 65–99)
POTASSIUM: 3.8 mmol/L (ref 3.5–5.1)
SODIUM: 139 mmol/L (ref 135–145)
Total Protein: 7.3 g/dL (ref 6.5–8.1)

## 2015-07-19 LAB — RAPID URINE DRUG SCREEN, HOSP PERFORMED
Amphetamines: NOT DETECTED
BENZODIAZEPINES: NOT DETECTED
Barbiturates: NOT DETECTED
COCAINE: NOT DETECTED
OPIATES: NOT DETECTED
TETRAHYDROCANNABINOL: NOT DETECTED

## 2015-07-19 LAB — CBC
HCT: 36.4 % — ABNORMAL LOW (ref 39.0–52.0)
HEMOGLOBIN: 12.3 g/dL — AB (ref 13.0–17.0)
MCH: 29.2 pg (ref 26.0–34.0)
MCHC: 33.8 g/dL (ref 30.0–36.0)
MCV: 86.5 fL (ref 78.0–100.0)
Platelets: 400 10*3/uL (ref 150–400)
RBC: 4.21 MIL/uL — ABNORMAL LOW (ref 4.22–5.81)
RDW: 14.9 % (ref 11.5–15.5)
WBC: 6.8 10*3/uL (ref 4.0–10.5)

## 2015-07-19 LAB — ETHANOL: Alcohol, Ethyl (B): <5 mg/dL (ref <5)

## 2015-07-19 LAB — SALICYLATE LEVEL

## 2015-07-19 LAB — ACETAMINOPHEN LEVEL

## 2015-07-19 MED ORDER — NICOTINE 21 MG/24HR TD PT24
21.0000 mg | MEDICATED_PATCH | Freq: Every day | TRANSDERMAL | Status: DC
  Administered 2015-07-19 – 2015-07-20 (×2): 21 mg via TRANSDERMAL
  Filled 2015-07-19 (×2): qty 1

## 2015-07-19 MED ORDER — ACETAMINOPHEN 325 MG PO TABS
650.0000 mg | ORAL_TABLET | ORAL | Status: DC | PRN
  Administered 2015-07-19 – 2015-07-20 (×2): 650 mg via ORAL
  Filled 2015-07-19 (×2): qty 2

## 2015-07-19 MED ORDER — LORAZEPAM 1 MG PO TABS
1.0000 mg | ORAL_TABLET | Freq: Three times a day (TID) | ORAL | Status: DC | PRN
  Administered 2015-07-19: 1 mg via ORAL
  Filled 2015-07-19: qty 1

## 2015-07-19 MED ORDER — ONDANSETRON HCL 4 MG PO TABS: 4.0000 mg | ORAL_TABLET | Freq: Three times a day (TID) | ORAL | Status: DC | PRN

## 2015-07-19 NOTE — ED Notes (Signed)
Delay in lab draw pt in bathroom,  Afterwards going to exray.

## 2015-07-19 NOTE — ED Provider Notes (Signed)
CSN: [id number]     Arrival date & time 07/19/15  2004 History  By signing my name below, I, Gonzella Lex, attest that this documentation has been prepared under the direction and in the presence of Antony Madura, [place] . Electronically Signed: Gonzella Lex, Scribe. 07/19/2015. 8:32 PM.    Chief Complaint  Patient presents with  . Suicidal  . Homicidal  . Medical Clearance     The history is provided by the patient. No language interpreter was used.    HPI Comments: Blake Petersen is a 24 y.o. male who presents to the Emergency Department for suicidal and homicidal ideation. He reports thoughts of cutting himself with a knife and thoughts of hurting his children's grandmother which began today. Pt punched a "wooden stilt" with his right hand today and now complains of mild pain which is constant. Pt had a previous boxers fracture in his right hand which was surgically repaired. He states he is supposed to be on psychiatric medications but ran out because his insurance lapsed. His last dose of these medications was two months ago. Pt denies alcohol and drug use. Pt denies seeing a therapist. He has no other complaints at this time.   Past Medical History  Diagnosis Date  . Bipolar 1 disorder (HCC)   . Asthma    Past Surgical History  Procedure Laterality Date  . Hand surgery    . Knee arthroscopy     History reviewed. No pertinent family history. Social History  Substance Use Topics  . Smoking status: Current Every Day Smoker -- 1.50 packs/day    Types: Cigarettes  . Smokeless tobacco: None  . Alcohol Use: Yes     Comment: occ    Review of Systems  Musculoskeletal: Positive for myalgias.       Right hand pain  Psychiatric/Behavioral: Positive for suicidal ideas, behavioral problems and self-injury.       Thoughts of injuring his children's grandmother   All other systems reviewed and are negative.   Allergies  Iodine and Shellfish allergy  Home Medications    Prior to Admission medications   Medication Sig Start Date End Date Taking? Authorizing Provider  ibuprofen (ADVIL,MOTRIN) 200 MG tablet Take 1,000 mg by mouth every 6 (six) hours as needed for moderate pain.   Yes Historical Provider, MD  divalproex (DEPAKOTE ER) 250 MG 24 hr tablet Take 3 tablets (750 mg total) by mouth at bedtime. 05/24/15   Beau Fanny, FNP  FLUoxetine (PROZAC) 20 MG capsule Take 1 capsule (20 mg total) by mouth at bedtime. 05/24/15   Beau Fanny, FNP  gabapentin (NEURONTIN) 100 MG capsule Take 2 capsules (200 mg total) by mouth 2 (two) times daily. 05/24/15   Beau Fanny, FNP  hydrOXYzine (ATARAX/VISTARIL) 25 MG tablet Take 1 tablet (25 mg total) by mouth every 6 (six) hours as needed for anxiety. 05/24/15   Beau Fanny, FNP  meloxicam (MOBIC) 15 MG tablet Take 1 tablet (15 mg total) by mouth daily. 05/24/15   Beau Fanny, FNP  nicotine (NICODERM CQ - DOSED IN MG/24 HOURS) 21 mg/24hr patch Place 1 patch (21 mg total) onto the skin daily. 05/24/15   Beau Fanny, FNP  traZODone (DESYREL) 150 MG tablet Take 0.5 tablets (75 mg total) by mouth at bedtime and may repeat dose one time if needed. 05/24/15   Everardo All Withrow, FNP   BP 152/102 mmHg  Pulse 82  Temp(Src) 98.3 F (36.8 C) (Oral)  Resp 15  SpO2 98%   Physical Exam  Constitutional: He is oriented to person, place, and time. He appears well-developed and well-nourished. No distress.  HENT:  Head: Normocephalic and atraumatic.  Eyes: Conjunctivae and EOM are normal. No scleral icterus.  Neck: Normal range of motion.  Pulmonary/Chest: Effort normal. No respiratory distress.  Musculoskeletal: Normal range of motion.  Neurological: He is alert and oriented to person, place, and time. He exhibits normal muscle tone. Coordination normal.  Skin: Skin is warm and dry. No rash noted. He is not diaphoretic. No erythema. No pallor.  Psychiatric: His speech is normal. He is withdrawn. He expresses homicidal and  suicidal ideation. He expresses suicidal plans and homicidal plans.  Flat affect  Nursing note and vitals reviewed.   ED Course  Procedures  DIAGNOSTIC STUDIES:    Oxygen Saturation is 98% on RA, normal by my interpretation.   COORDINATION OF CARE:  8:32 PM Will order blood work and have pt talk with a counselor in the ED. Discussed treatment plan with pt at bedside and pt agreed to plan.   Labs Review Labs Reviewed  ACETAMINOPHEN LEVEL - Abnormal; Notable for the following:    Acetaminophen (Tylenol), Serum <10 (*)    All other components within normal limits  CBC - Abnormal; Notable for the following:    RBC 4.21 (*)    Hemoglobin 12.3 (*)    HCT 36.4 (*)    All other components within normal limits  COMPREHENSIVE METABOLIC PANEL  ETHANOL  SALICYLATE LEVEL  URINE RAPID DRUG SCREEN, HOSP PERFORMED    Imaging Review Dg Hand Complete Right  07/19/2015  CLINICAL DATA:  Pain after hitting wooden beam EXAM: RIGHT HAND - COMPLETE 3+ VIEW COMPARISON:  None. FINDINGS: Frontal, oblique, and lateral views were obtained. There is no demonstrable acute fracture or dislocation. There is calcification in the triangular fibrocartilage region, likely due to an old avulsion of the ulnar styloid. There is no appreciable joint space narrowing. No erosive change. IMPRESSION: Evidence of old avulsion of the ulnar styloid with calcification in the triangular fibrocartilage region. No acute fracture or dislocation. No appreciable arthropathy. Electronically Signed   By: Bretta BangWilliam  Woodruff III M.D.   On: 07/19/2015 20:33     I have personally reviewed and evaluated these images and lab results as part of my medical decision-making.   EKG Interpretation None      MDM   Final diagnoses:  Bipolar disorder, current episode depressed, severe, without psychotic features (HCC)    24 year old male presents to the emergency department for SI/HI. He has been medically cleared. TTS recommend  inpatient psychiatric treatment. Placement is pending at this time. Disposition to be determined oncoming ED provider.  I personally performed the services described in this documentation, which was scribed in my presence. The recorded information has been reviewed and is accurate.     Antony MaduraKelly Jem Castro, PA-C 07/20/15 [id number]  April Palumbo, MD 07/20/15 2330

## 2015-07-19 NOTE — ED Notes (Addendum)
Admission Note: Blake RimaDalonte is still endorsing passive SI and HI towards his children's maternal grandmother. He contracts for safety. Denies [facility].  Who is not allowing him to see his 26 year old twins. He rates Depression 8/10 Anxiety 10/10. +Back, b/l knee and arm pain 9/10. He states the pain is from chopping wood the other day. He reports being off of his depakote for almost 2 months now. [facility]. He states if he is discharged he feels he will end up being readmitted and will likely harm himself. He is calm and cooperative at this time.

## 2015-07-19 NOTE — ED Notes (Signed)
Pt states he was planning on cutting his wrists with a knife today. Steak knife brought in with [employer]. Also states he wants to kill his children's grandmother because she is trying to gain custody of his children. States he has been treated in the past for depression or anxiety. Also complaining of his right hand hurting because he punched a wooden beam before coming here, no obvious injury or deformity.

## 2015-07-19 NOTE — ED Notes (Addendum)
Steak knife was handed over by [employer] and given to our security to be locked up in a sharps bag with his sticker on it. Informed BH nurse that I gave report to. All other belongings placed in patient belonging bag and given to accepting nursing staff. Pt being wanded at this time and moved back to the behavioral health unit.
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
## 2015-07-20 DIAGNOSIS — R45851 Suicidal ideations: Secondary | ICD-10-CM

## 2015-07-20 DIAGNOSIS — R45850 Homicidal ideations: Secondary | ICD-10-CM

## 2015-07-20 DIAGNOSIS — F3130 Bipolar disorder, current episode depressed, mild or moderate severity, unspecified: Secondary | ICD-10-CM

## 2015-07-20 MED ORDER — TRAZODONE HCL 50 MG PO TABS
150.0000 mg | ORAL_TABLET | Freq: Every day | ORAL | Status: DC
  Administered 2015-07-20: 150 mg via ORAL
  Filled 2015-07-20: qty 1

## 2015-07-20 MED ORDER — FLUOXETINE HCL 20 MG PO CAPS
20.0000 mg | ORAL_CAPSULE | Freq: Every day | ORAL | Status: DC
  Administered 2015-07-20 – 2015-07-21 (×2): 20 mg via ORAL
  Filled 2015-07-20 (×2): qty 1

## 2015-07-20 MED ORDER — LISINOPRIL 10 MG PO TABS
10.0000 mg | ORAL_TABLET | Freq: Every day | ORAL | Status: DC
  Administered 2015-07-20 – 2015-07-21 (×2): 10 mg via ORAL
  Filled 2015-07-20 (×2): qty 1

## 2015-07-20 MED ORDER — GABAPENTIN 100 MG PO CAPS
200.0000 mg | ORAL_CAPSULE | Freq: Two times a day (BID) | ORAL | Status: DC
  Administered 2015-07-20 – 2015-07-21 (×3): 200 mg via ORAL
  Filled 2015-07-20 (×3): qty 2

## 2015-07-20 MED ORDER — DIVALPROEX SODIUM ER 500 MG PO TB24
750.0000 mg | ORAL_TABLET | Freq: Every day | ORAL | Status: DC
  Administered 2015-07-20: 750 mg via ORAL
  Filled 2015-07-20 (×2): qty 1

## 2015-07-20 MED ORDER — HYDROXYZINE HCL 25 MG PO TABS: 50.0000 mg | ORAL_TABLET | Freq: Three times a day (TID) | ORAL | Status: DC | PRN
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
## 2015-07-20 NOTE — ED Notes (Signed)
Avrum says he has thoughts of cutting himself, same as he did before his previous hospitalization. He also endorses HI to his children's grandmother. He contracts for safety on unit. He is calm and cooperative at present. Encouraged him to approach staff with needs/concerns. Will continue to monitor for needs/safety.

## 2015-07-20 NOTE — BHH Counselor (Signed)
TTS Counselor faxed referrals to the following facilities in effort to obtain inpt placement:  [facility]  [facility]

## 2015-07-20 NOTE — ED Notes (Signed)
Patient resting.  Psychiatric assessment deferred.

## 2015-07-20 NOTE — BHH Counselor (Signed)
Per Donell SievertSpencer Simon, PA, Pt meets inpt criteria. [facility]. T[facility] to seek placement.

## 2015-07-20 NOTE — ED Notes (Signed)
Ortho tech informed of immobilize order.  Suggested to elevate and ice.

## 2015-07-21 ENCOUNTER — Inpatient Hospital Stay (HOSPITAL_COMMUNITY)
Admission: AD | Admit: 2015-07-21 | Discharge: 2015-07-24 | DRG: 885 | Disposition: A | Discharge status: Home / Self Care | Payer: Federal, State, Local not specified - Other | Source: Intra-hospital | Attending: Psychiatry | Admitting: Psychiatry

## 2015-07-21 ENCOUNTER — Encounter (HOSPITAL_COMMUNITY): Payer: Self-pay | Admitting: *Deleted

## 2015-07-21 DIAGNOSIS — F3162 Bipolar disorder, current episode mixed, moderate: Secondary | ICD-10-CM | POA: Diagnosis present

## 2015-07-21 DIAGNOSIS — F17210 Nicotine dependence, cigarettes, uncomplicated: Secondary | ICD-10-CM | POA: Diagnosis present

## 2015-07-21 DIAGNOSIS — F314 Bipolar disorder, current episode depressed, severe, without psychotic features: Secondary | ICD-10-CM | POA: Diagnosis present

## 2015-07-21 DIAGNOSIS — F319 Bipolar disorder, unspecified: Secondary | ICD-10-CM | POA: Diagnosis present

## 2015-07-21 DIAGNOSIS — R45850 Homicidal ideations: Secondary | ICD-10-CM | POA: Diagnosis present

## 2015-07-21 DIAGNOSIS — R45851 Suicidal ideations: Secondary | ICD-10-CM | POA: Diagnosis present

## 2015-07-21 MED ORDER — ALUM & MAG HYDROXIDE-SIMETH 200-200-20 MG/5ML PO SUSP
30.0000 mL | ORAL | Status: DC | PRN
Start: 2015-07-21 — End: 2015-07-24

## 2015-07-21 MED ORDER — ACETAMINOPHEN 325 MG PO TABS: 650.0000 mg | ORAL_TABLET | Freq: Four times a day (QID) | ORAL | Status: DC | PRN

## 2015-07-21 MED ORDER — FLUOXETINE HCL 20 MG PO CAPS
20.0000 mg | ORAL_CAPSULE | Freq: Every day | ORAL | Status: DC
  Administered 2015-07-22: 20 mg via ORAL
  Filled 2015-07-21 (×2): qty 1

## 2015-07-21 MED ORDER — TRAZODONE HCL 150 MG PO TABS
150.0000 mg | ORAL_TABLET | Freq: Every day | ORAL | Status: DC
  Administered 2015-07-21: 150 mg via ORAL
  Filled 2015-07-21 (×2): qty 1

## 2015-07-21 MED ORDER — LISINOPRIL 10 MG PO TABS
10.0000 mg | ORAL_TABLET | Freq: Every day | ORAL | Status: DC
  Administered 2015-07-22 – 2015-07-24 (×3): 10 mg via ORAL
  Filled 2015-07-21 (×4): qty 1
  Filled 2015-07-21: qty 7

## 2015-07-21 MED ORDER — DIVALPROEX SODIUM ER 250 MG PO TB24
750.0000 mg | ORAL_TABLET | Freq: Every day | ORAL | Status: DC
  Administered 2015-07-21: 750 mg via ORAL
  Filled 2015-07-21 (×2): qty 3

## 2015-07-21 MED ORDER — MAGNESIUM HYDROXIDE 400 MG/5ML PO SUSP: 30.0000 mL | Freq: Every day | ORAL | Status: DC | PRN

## 2015-07-21 MED ORDER — GABAPENTIN 100 MG PO CAPS
200.0000 mg | ORAL_CAPSULE | Freq: Two times a day (BID) | ORAL | Status: DC
  Administered 2015-07-21 – 2015-07-22 (×2): 200 mg via ORAL
  Filled 2015-07-21 (×4): qty 2

## 2015-07-21 MED ORDER — HYDROXYZINE HCL 50 MG PO TABS
50.0000 mg | ORAL_TABLET | Freq: Three times a day (TID) | ORAL | Status: DC | PRN
  Filled 2015-07-21: qty 10

## 2015-07-21 MED ORDER — NICOTINE 21 MG/24HR TD PT24
21.0000 mg | MEDICATED_PATCH | Freq: Every day | TRANSDERMAL | Status: DC
  Administered 2015-07-21 – 2015-07-24 (×4): 21 mg via TRANSDERMAL
  Filled 2015-07-21 (×8): qty 1

## 2015-07-21 NOTE — Tx Team (Signed)
Initial Interdisciplinary Treatment Plan   PATIENT STRESSORS: Marital or family conflict   PATIENT STRENGTHS: Ability for insight General fund of knowledge   PROBLEM LIST: Problem List/Patient Goals Date to be addressed Date deferred Reason deferred Estimated date of resolution  depression 07/21/15                                                      DISCHARGE CRITERIA:  Improved stabilization in mood, thinking, and/or behavior  PRELIMINARY DISCHARGE PLAN: Return to previous living arrangement  PATIENT/FAMIILY INVOLVEMENT: This treatment plan has been presented to and reviewed with the patient, Blake Petersen, and/or family member.  The patient and family have been given the opportunity to ask questions and make suggestions.  Blake Petersen, Blake Petersen 07/21/2015, 2:16 PM

## 2015-07-21 NOTE — Progress Notes (Signed)
Patient ID: Blake Petersen, male   DOB: 03-28-1991, 24 y.o.   MRN: [id number]   24 year old black male admitted [facility] reporting SI/HI with a plan. Pt reported that he was very depressed and that he was about to lose his house and possibly his kids. Pt reported that losing his kids in not an option, yet his kids grandmother is trying to get custody of them. Pt reported that at present he could contract for safety, but still wants to hurt his kids grandmother. Pt reported that he does not use any drugs or alcohol. Pt reported that his depression was a 8, his hopelessness was a 4, and his anxiety was a 8. Pt reported that he was suppose to be on meds, however has not taken any meds in the last 2 months.

## 2015-07-21 NOTE — Progress Notes (Signed)
Writer introduced self to pt. Pt endorses SI/HI. Pt verbally contracts for safety. No complaints verbalized by pt. Pt safety maintained. Will monitor pt q 15 minute checks performed by staff.
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
## 2015-07-22 ENCOUNTER — Encounter (HOSPITAL_COMMUNITY): Payer: Self-pay | Admitting: Psychiatry

## 2015-07-22 DIAGNOSIS — F3162 Bipolar disorder, current episode mixed, moderate: Secondary | ICD-10-CM | POA: Diagnosis present

## 2015-07-22 DIAGNOSIS — R45851 Suicidal ideations: Secondary | ICD-10-CM

## 2015-07-22 LAB — TSH: TSH: 1.313 u[IU]/mL (ref 0.350–4.500)

## 2015-07-22 MED ORDER — TRAZODONE HCL 100 MG PO TABS
200.0000 mg | ORAL_TABLET | Freq: Every day | ORAL | Status: DC
  Administered 2015-07-22: 200 mg via ORAL
  Filled 2015-07-22 (×3): qty 2

## 2015-07-22 MED ORDER — GABAPENTIN 300 MG PO CAPS
300.0000 mg | ORAL_CAPSULE | Freq: Three times a day (TID) | ORAL | Status: DC
  Administered 2015-07-22 – 2015-07-24 (×7): 300 mg via ORAL
  Filled 2015-07-22 (×4): qty 1
  Filled 2015-07-22: qty 21
  Filled 2015-07-22 (×3): qty 1
  Filled 2015-07-22: qty 21
  Filled 2015-07-22: qty 1
  Filled 2015-07-22: qty 21
  Filled 2015-07-22: qty 1

## 2015-07-22 MED ORDER — ARIPIPRAZOLE 5 MG PO TABS
5.0000 mg | ORAL_TABLET | Freq: Every day | ORAL | Status: DC
  Administered 2015-07-22 – 2015-07-24 (×3): 5 mg via ORAL
  Filled 2015-07-22 (×2): qty 1
  Filled 2015-07-22: qty 7
  Filled 2015-07-22 (×2): qty 1

## 2015-07-22 MED ORDER — DIVALPROEX SODIUM ER 500 MG PO TB24
1000.0000 mg | ORAL_TABLET | Freq: Every day | ORAL | Status: DC
  Administered 2015-07-22 – 2015-07-23 (×2): 1000 mg via ORAL
  Filled 2015-07-22: qty 14
  Filled 2015-07-22 (×3): qty 2

## 2015-07-22 NOTE — BHH Group Notes (Signed)
   06/24/2015     1:15-2:15PM  Summary of Progress/Problems:   In today's process group a decisional balance exercise was used to explore in depth the perceived benefits and costs of using the unhealthy coping techniques identified by each patient as one they often use, as well as the  benefits and costs of replacing these with healthy coping skills.  Four patients were very disruptive throughout group and very distracting both to the group process and to staying on task to talk about any meaningful feelings.  CSW addressed this repeatedly and redirected these individuals, but ultimately ended group early with an apology that it was not more productive.  The patient expressed that buying unnecessary things when he is angry is his main unhealthy coping mechanism, adding that he has an attic and closets full of things he does not need and that his children do not need.  When others try to help him look at the possibility that this is an unhealthy coping mechanism that he may want to change, he would not allow them to finish their statements before jumping in with responses.  He had numerous side conversations and while he said he understood it was distracting, said he "only did it" because there were important things he needed to say to others.  He was one of the disruptive people in group and while he made some meaningful contributions, he seemed unaware of his resistance and distracting behaviors.   Type of Therapy:  Group Therapy - Process   Participation Level:  Active  Participation Quality:  Intrusive  Affect:  Blunted and Irritable  Cognitive:  Alert  Insight:  Limited  Engagement in Therapy:  Limited  Modes of Intervention:  Education, Motivational Interviewing  Ambrose MantleMareida Grossman-Orr, LCSW 07/22/2015, 2:40 PM

## 2015-07-22 NOTE — Progress Notes (Signed)
D: Blake Petersen is very inappropriate on the unit. Yelling and cursing when he feels his needs are not being met. He incites the other young male patients into having inappropriate conversations about some of the male patients and staff on the unit. He is attention seeking, intrusive and argumentative. He was very unhappy with the male RN he was assigned to earlier on the night shift and I offered to be his nurse for the night. He progressively became calmer however still childlike in his behavior at times.  A: Encouragement and support given.  R: Continue to monitor for patient safety and medication effectiveness.
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
## 2015-07-22 NOTE — B[facility] Group Notes (Signed)
B[facility] Group Notes:  (Nursing/MHT/Case Management/Adjunct)  Date:  07/22/2015  Time:  10:59 AM  Type of Therapy:  Psychoeducational Skills  Participation Level:  Active  Participation Quality:  Appropriate  Affect:  Appropriate  Cognitive:  Appropriate  Insight:  Appropriate  Engagement in Group:  Engaged  Modes of Intervention:  Discussion  Summary of Progress/Problems: Pt did attend self inventory group.   Jacquelyne BalintForrest, Randi Poullard Shanta 07/22/2015, 10:59 AM

## 2015-07-22 NOTE — BHH Suicide Risk Assessment (Signed)
[facility] INPATIENT:  Family/Significant Other Suicide Prevention Education  Suicide Prevention Education:  Patient Refusal for Family/Significant Other Suicide Prevention Education: The patient Blake Petersen has refused to provide written consent for family/significant other to be provided Family/Significant Other Suicide Prevention Education during admission and/or prior to discharge.  Physician notified.  Blake Petersen, Nandita Mathenia Jo 07/22/2015, 9:37 AM

## 2015-07-22 NOTE — H&P (Signed)
Psychiatric Admission Assessment Adult  Patient Identification: Blake Petersen  MRN:  [id number]  Date of Evaluation:  07/22/2015  Chief Complaint:  Bipolar Affective Disorder, Current episode Depressed  Principal Diagnosis: Bipolar disorder with severe depression ([place])  Diagnosis:   Patient Active Problem List   Diagnosis Date Noted  . Bipolar disorder with severe depression ([place]) [F31.4] 07/21/2015  . Bipolar affective disorder, current episode depressed ([place]) [F31.30] 05/16/2015   History of Present Illness: Blake Petersen is a 24 year old AA male, admitted to [facility] from the [place] hospital with complaints of suicide thoughts & homicidal ideations towards his childrens' grand-mother. Blake Petersen was a patient in this hospital last September for anger issues associated with Bipolar disorder. He was discharged to follow-up care at [facility]. This time around, he reports, "[facility] police escorted me to the hospital. I was feeling suicidal & homicidal towards my kid's grandmother. I attempted to cut myself, but the knife was dull. My kid's grandmother called & told me that I could not see my kids no more. I got mad & developed bad thoughts. I threatened to kill her. I have not been on my medicines for 2 months since I left this hospital in September. I was discharged to follow-up care at [facility], but did not make the appointment. Besides, the Prozac pill did not help me no way. I'm no longer homicidal, but suicidal".  Elements:  Location:  Bipolar affective disorder. Quality:  Worsening anger issues, lashing out, homicidal/suicidal ideations, suiciide attempt. Severity:  Severe, "I attempted to cut myself, but the knife was dull". Timing:  Current. Duration:  Chronic. Context:  "My kids grandmother told that I could not see my kids".  Associated Signs/Symptoms:  Depression Symptoms:  depressed mood, hopelessness, suicidal thoughts with specific plan, homicidal thoughts  (Hypo) Manic  Symptoms:  Impulsivity, Irritable Mood, Labiality of Mood,  Anxiety Symptoms:  None reported  Psychotic Symptoms:  Denies  PTSD Symptoms: NA  Total Time spent with patient: 1 hour  Past Medical History:  Past Medical History  Diagnosis Date  . Bipolar 1 disorder ([place])   . Asthma     Past Surgical History  Procedure Laterality Date  . Hand surgery    . Knee arthroscopy     Family History: History reviewed. No pertinent family history.  Social History:  History  Alcohol Use  . Yes    Comment: occ     History  Drug Use  . Yes  . Special: Marijuana    Social History   Social History  . Marital Status: Single    Spouse Name: N/A  . Number of Children: N/A  . Years of Education: N/A   Social History Main Topics  . Smoking status: Current Every Day Smoker -- 1.50 packs/day    Types: Cigarettes  . Smokeless tobacco: None  . Alcohol Use: Yes     Comment: occ  . Drug Use: Yes    Special: Marijuana  . Sexual Activity: No   Other Topics Concern  . None   Social History Narrative   Additional Social History:    Pain Medications: none Prescriptions: none Over the Counter: none History of alcohol / drug use?: No history of alcohol / drug abuse  Musculoskeletal: Strength & Muscle Tone: within normal limits Gait & Station: normal Patient leans: N/A  Psychiatric Specialty Exam: Physical Exam  Constitutional: He is oriented to person, place, and time. He appears well-developed and well-nourished.  HENT:  Head: Normocephalic.  Eyes: Pupils  are equal, round, and reactive to light.  Neck: Normal range of motion.  Cardiovascular: Normal rate.   Respiratory: Effort normal.  GI: Soft.  Genitourinary:  Denies any issues in this area   Musculoskeletal: Normal range of motion.  Neurological: He is alert and oriented to person, place, and time.  Skin: Skin is warm and dry.  Psychiatric: His speech is normal and behavior is normal. Judgment and thought  content normal. His mood appears anxious. His affect is not angry, not blunt, not labile and not inappropriate. Cognition and memory are normal. He exhibits a depressed mood.    Review of Systems  Constitutional: Negative.   HENT: Negative.   Eyes: Negative.   Respiratory: Negative.   Cardiovascular: Negative.   Gastrointestinal: Negative.   Genitourinary: Negative.   Musculoskeletal: Negative.   Skin: Negative.   Neurological: Negative.   Endo/Heme/Allergies: Negative.   Psychiatric/Behavioral: Positive for depression and suicidal ideas. Negative for hallucinations, memory loss and substance abuse. The patient is nervous/anxious and has insomnia.     Blood pressure 139/90, pulse 92, temperature 98.3 F (36.8 C), temperature source Oral, resp. rate 18, height 5' 10"  (1.778 m), weight 135.172 kg (298 lb).Body mass index is 42.76 kg/(m^2).  General Appearance: Casual, obese  Eye Contact::  Fair  Speech:  Clear and Coherent  Volume:  Normal  Mood:  Depressed and Hopeless  Affect:  Restricted  Thought Process:  Coherent  Orientation:  Full (Time, Place, and Person)  Thought Content:  Rumination  Suicidal Thoughts:  Yes.  without intent/plan  Homicidal Thoughts:  No  Memory:  Grossly intact  Judgement:  Fair  Insight:  Fair  Psychomotor Activity:  Decreased  Concentration:  Fair  Recall:  Good  Fund of Knowledge:Fair  Language: Good  Akathisia:  No  Handed:  Right  AIMS (if indicated):     Assets:  Communication Skills Desire for Improvement  ADL's:  Intact  Cognition: WNL  Sleep:  Number of Hours: 6   Risk to Self: Is patient at risk for suicide?: Yes Risk to Others: No Prior Inpatient Therapy: Yes Prior Outpatient Therapy: Yes  Alcohol Screening: 1. How often do you have a drink containing alcohol?: Never 9. Have you or someone else been injured as a result of your drinking?: No 10. Has a relative or friend or a doctor or another health worker been concerned about  your drinking or suggested you cut down?: No Alcohol Use Disorder Identification Test Final Score (AUDIT): 0 Brief Intervention: AUDIT score less than 7 or less-screening does not suggest unhealthy drinking-brief intervention not indicated  Allergies:   Allergies  Allergen Reactions  . Iodine Anaphylaxis  . Shellfish Allergy Anaphylaxis   Lab Results:  No results found for this or any previous visit (from the past 48 hour(s)). Current Medications: Current Facility-Administered Medications  Medication Dose Route Frequency Provider Last Rate Last Dose  . acetaminophen (TYLENOL) tablet 650 mg  650 mg Oral Q6H PRN Delfin Gant, NP      . alum & mag hydroxide-simeth (MAALOX/MYLANTA) 200-200-20 MG/5ML suspension 30 mL  30 mL Oral Q4H PRN Delfin Gant, NP      . ARIPiprazole (ABILIFY) tablet 5 mg  5 mg Oral Daily Nicholaus Bloom, MD      . divalproex (DEPAKOTE ER) 24 hr tablet 1,000 mg  1,000 mg Oral QHS Nicholaus Bloom, MD      . gabapentin (NEURONTIN) capsule 300 mg  300 mg Oral TID Woodroe Chen  Sabra Heck, MD      . hydrOXYzine (ATARAX/VISTARIL) tablet 50 mg  50 mg Oral TID PRN Delfin Gant, NP      . lisinopril (PRINIVIL,ZESTRIL) tablet 10 mg  10 mg Oral Daily Delfin Gant, NP   10 mg at 07/22/15 0816  . magnesium hydroxide (MILK OF MAGNESIA) suspension 30 mL  30 mL Oral Daily PRN Delfin Gant, NP      . nicotine (NICODERM CQ - dosed in mg/24 hours) patch 21 mg  21 mg Transdermal Daily Jenne Campus, MD   21 mg at 07/22/15 0817  . traZODone (DESYREL) tablet 200 mg  200 mg Oral QHS Nicholaus Bloom, MD       PTA Medications: Prescriptions prior to admission  Medication Sig Dispense Refill Last Dose  . divalproex (DEPAKOTE ER) 250 MG 24 hr tablet Take 3 tablets (750 mg total) by mouth at bedtime. 90 tablet 0 Unknown at Unknown time  . FLUoxetine (PROZAC) 20 MG capsule Take 1 capsule (20 mg total) by mouth at bedtime. 3 capsule 0 Unknown at Unknown time  . gabapentin  (NEURONTIN) 100 MG capsule Take 2 capsules (200 mg total) by mouth 2 (two) times daily. 120 capsule 0 Unknown at Unknown time  . hydrOXYzine (ATARAX/VISTARIL) 25 MG tablet Take 1 tablet (25 mg total) by mouth every 6 (six) hours as needed for anxiety. 30 tablet 0 Unknown at Unknown time  . ibuprofen (ADVIL,MOTRIN) 200 MG tablet Take 1,000 mg by mouth every 6 (six) hours as needed for moderate pain.   Unknown at Unknown time  . meloxicam (MOBIC) 15 MG tablet Take 1 tablet (15 mg total) by mouth daily. 30 tablet 0 Unknown at Unknown time  . nicotine (NICODERM CQ - DOSED IN MG/24 HOURS) 21 mg/24hr patch Place 1 patch (21 mg total) onto the skin daily. 28 patch 0 Unknown at Unknown time  . traZODone (DESYREL) 150 MG tablet Take 0.5 tablets (75 mg total) by mouth at bedtime and may repeat dose one time if needed. 15 tablet 0 Unknown at Unknown time   Previous Psychotropic Medications: Yes   Substance Abuse History in the last 12 months:  No.  Consequences of Substance Abuse: NA  Results for orders placed or performed during the hospital encounter of 07/19/15 (from the past 72 hour(s))  Urine rapid drug screen (hosp performed) (Not at [facility])     Status: None   Collection Time: 07/19/15  8:23 PM  Result Value Ref Range   Opiates NONE DETECTED NONE DETECTED   Cocaine NONE DETECTED NONE DETECTED   Benzodiazepines NONE DETECTED NONE DETECTED   Amphetamines NONE DETECTED NONE DETECTED   Tetrahydrocannabinol NONE DETECTED NONE DETECTED   Barbiturates NONE DETECTED NONE DETECTED    Comment:        DRUG SCREEN FOR MEDICAL PURPOSES ONLY.  IF CONFIRMATION IS NEEDED FOR ANY PURPOSE, NOTIFY LAB WITHIN 5 DAYS.        LOWEST DETECTABLE LIMITS FOR URINE DRUG SCREEN Drug Class       Cutoff (ng/mL) Amphetamine      1000 Barbiturate      200 Benzodiazepine   161 Tricyclics       096 Opiates          300 Cocaine          300 THC              50   Comprehensive metabolic panel     Status: None  Collection Time: 07/19/15  8:35 PM  Result Value Ref Range   Sodium 139 135 - 145 mmol/L   Potassium 3.8 3.5 - 5.1 mmol/L   Chloride 109 101 - 111 mmol/L   CO2 22 22 - 32 mmol/L   Glucose, Bld 97 65 - 99 mg/dL   BUN 12 6 - 20 mg/dL   Creatinine, Ser 0.78 0.61 - 1.24 mg/dL   Calcium 9.2 8.9 - 10.3 mg/dL   Total Protein 7.3 6.5 - 8.1 g/dL   Albumin 3.8 3.5 - 5.0 g/dL   AST 23 15 - 41 U/L   ALT 24 17 - 63 U/L   Alkaline Phosphatase 65 38 - 126 U/L   Total Bilirubin 0.3 0.3 - 1.2 mg/dL   GFR calc non Af Amer >60 >60 mL/min   GFR calc Af Amer >60 >60 mL/min    Comment: (NOTE) The eGFR has been calculated using the CKD EPI equation. This calculation has not been validated in all clinical situations. eGFR's persistently <60 mL/min signify possible Chronic Kidney Disease.    Anion gap 8 5 - 15  Ethanol (ETOH)     Status: None   Collection Time: 07/19/15  8:35 PM  Result Value Ref Range   Alcohol, Ethyl (B) <5 <5 mg/dL    Comment:        LOWEST DETECTABLE LIMIT FOR SERUM ALCOHOL IS 5 mg/dL FOR MEDICAL PURPOSES ONLY   Salicylate level     Status: None   Collection Time: 07/19/15  8:35 PM  Result Value Ref Range   Salicylate Lvl <2.8 2.8 - 30.0 mg/dL  Acetaminophen level     Status: Abnormal   Collection Time: 07/19/15  8:35 PM  Result Value Ref Range   Acetaminophen (Tylenol), Serum <10 (L) 10 - 30 ug/mL    Comment:        THERAPEUTIC CONCENTRATIONS VARY SIGNIFICANTLY. A RANGE OF 10-30 ug/mL MAY BE AN EFFECTIVE CONCENTRATION FOR MANY PATIENTS. HOWEVER, SOME ARE BEST TREATED AT CONCENTRATIONS OUTSIDE THIS RANGE. ACETAMINOPHEN CONCENTRATIONS >150 ug/mL AT 4 HOURS AFTER INGESTION AND >50 ug/mL AT 12 HOURS AFTER INGESTION ARE OFTEN ASSOCIATED WITH TOXIC REACTIONS.   CBC     Status: Abnormal   Collection Time: 07/19/15  8:35 PM  Result Value Ref Range   WBC 6.8 4.0 - 10.5 K/uL   RBC 4.21 (L) 4.22 - 5.81 MIL/uL   Hemoglobin 12.3 (L) 13.0 - 17.0 g/dL   HCT 36.4 (L) 39.0  - 52.0 %   MCV 86.5 78.0 - 100.0 fL   MCH 29.2 26.0 - 34.0 pg   MCHC 33.8 30.0 - 36.0 g/dL   RDW 14.9 11.5 - 15.5 %   Platelets 400 150 - 400 K/uL    Observation Level/Precautions:  15 minute checks  Laboratory:  Per ED  Psychotherapy: Group sessions   Medications: See medication changes  Consultations: As needed   Discharge Concerns: Mood stabiliity   Estimated LOS: 5-7 days  Other:     Psychological Evaluations: Yes   Treatment Plan Summary: Daily contact with patient to assess and evaluate symptoms and progress in treatment and Medication management: 1. Admit for crisis management and stabilization, estimated length of stay 3-5 days.  2. Medication management to reduce current symptoms to base line and improve the patient's overall level of functioning; Depakote ER 100 mg for mood stabilization, Abilify 5 mg for mood control, Gabapentin 300 mg for agitation, Hydroxyzine 50 mg for anxiety, Trazodone 200 mg for insomnia, Nicotine patch  21 mg for nicotine dependence 3. Treat health problems as indicated: Resume Lisinopril 10 mg for high blood pressure. 4. Develop treatment plan to decrease the need for readmission.  5. Psycho-social education regarding self care.  6. Health care follow up as needed for medical problems.  7. Review, reconcile, and reinstate any pertinent home medications for other health issues where appropriate. 8. Call for consults with hospitalist for any additional specialty patient care services as needed.  Medical Decision Making:  New problem, with additional work up planned, Review of Psycho-Social Stressors (1), Review or order clinical lab tests (1), Review and summation of old records (2), Review of Medication Regimen & Side Effects (2) and Review of New Medication or Change in Dosage (2)  I certify that inpatient services furnished can reasonably be expected to improve the patient's condition.   Encarnacion Slates, PMHNP, FNP-BC 11/26/201611:23 AM I  personally assessed the patient, reviewed the physical exam and labs and formulated the treatment plan Geralyn Flash A. Sabra Heck, M.D.

## 2015-07-22 NOTE — Progress Notes (Signed)
D Muscab cont to struggle with  ' my bipolar' , as he puts it. HE completed his daily assessment  First thing this morning and on it he wrote he  Has ahd SI today. He says to this RN, when asked about it " You know, Mis P----, I'm not going to hurt myself".  He rated his depression, hopelessness and anxiety " 8/7/8", respectively and he he said ' Im messed up".  He attends his groups and he demonstrates he is engaged in his recovery as evidenced by his verbal participation in the Life SKills discussion.   A He is started on po abilify 5 mg po, his prozac is dc'd, he is scheduled for a TSH at 1930 today, his neurontin is increased from  200 mg to 300 mg po tida , his trazadone is increased from 150 to 200mg  po qhs and his depakote is increased to 1000 mg qhs. These changes are reviewed with hism by this Clinical research associatewriter and he stated understanding.   R Safety is in place and poc cont.
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
## 2015-07-23 DIAGNOSIS — F314 Bipolar disorder, current episode depressed, severe, without psychotic features: Secondary | ICD-10-CM

## 2015-07-23 MED ORDER — TRAZODONE HCL 150 MG PO TABS
150.0000 mg | ORAL_TABLET | Freq: Every day | ORAL | Status: DC
  Administered 2015-07-23: 150 mg via ORAL
  Filled 2015-07-23 (×2): qty 1
  Filled 2015-07-23: qty 7

## 2015-07-23 NOTE — Plan of Care (Signed)
Problem: Diagnosis: Increased Risk For Suicide Attempt Goal: STG-Patient Will Comply With Medication Regime Outcome: Progressing Patient is compliant with scheduled medications.     

## 2015-07-23 NOTE — [facility] Group Notes (Signed)
[facility] Group Notes:  (Clinical Social Work)   07/23/2015    1:15-2:15PM  Summary of Progress/Problems:   The main focus of today's process group was to   1)  discuss the importance of adding supports  2)  define health supports versus unhealthy supports  3)  identify the patient's current unhealthy supports and plan how to handle them  4)  Identify the patient's current healthy supports and plan what to add.  An emphasis was placed on using counselor, doctor, therapy groups, 12-step groups, and problem-specific support groups to expand supports.    The patient expressed anger at the beginning of group when group rules were being established, saying it was not fair to say that people should not curse during group.  He finally left the room angrily saying it was "only if you're black."  He later came back to the group but did not talk any until he started joking around some with others in side conversations which had also been stated at the beginning of group to be disrespectful.    Type of Therapy:  Process Group with Motivational Interviewing  Participation Level:  Minimal  Participation Quality:  Resistant and Angry about group rules  Affect:  Blunted, Flat and Irritable  Cognitive:  Oriented  Insight:  Lacking  Engagement in Therapy: Poor  Modes of Intervention:   Education, Support and Processing, Activity  Ambrose MantleMareida Grossman-Orr, LCSW 07/23/2015   4:53 PM

## 2015-07-23 NOTE — Progress Notes (Signed)
Patient ID: Blake Petersen, male   DOB: [birth year], 24 y.o.   MRN: [id number] Patient ID: Blake Petersen, male   DOB: August 21, 1991, 24 y.o.   MRN: [id number] [facility] MD Progress Note  07/23/2015 2:18 PM Blake Petersen  MRN:  [id number]  Subjective:    Blake Petersen says, "I'm feeling better today". Denies any adverse effects from medications  Objective : I have reviewed case and have met with patient. Patient says his mood has improved compared to admission, affect appropriate. He is visible on the unit, participating in the group milieu. He says his plan to stay after discharge is stay on his medication & make his follow-up care appointments. He currently denies any new issues.  Principal Problem:  Bipolar Disorder, Depressed   Diagnosis:   Patient Active Problem List   Diagnosis Date Noted  . Bipolar 1 disorder, mixed, moderate ([place]) [F31.62] 07/22/2015   Total Time spent with patient: 25 minutes  Past Medical History:  Past Medical History  Diagnosis Date  . Bipolar 1 disorder ([place])   . Asthma     Past Surgical History  Procedure Laterality Date  . Hand surgery    . Knee arthroscopy     Family History: History reviewed. No pertinent family history.  Social History:  History  Alcohol Use  . Yes    Comment: occ     History  Drug Use  . Yes  . Special: Marijuana    Social History   Social History  . Marital Status: Single    Spouse Name: N/A  . Number of Children: N/A  . Years of Education: N/A   Social History Main Topics  . Smoking status: Current Every Day Smoker -- 1.50 packs/day    Types: Cigarettes  . Smokeless tobacco: None  . Alcohol Use: Yes     Comment: occ  . Drug Use: Yes    Special: Marijuana  . Sexual Activity: No   Other Topics Concern  . None   Social History Narrative   Additional History:    Sleep:   Improved   Appetite:  Good  Assessment:   Musculoskeletal: Strength & Muscle Tone: within normal limits Gait & Station: normal Patient leans:  N/A  Psychiatric Specialty Exam: Physical Exam  Review of Systems  HENT: Positive for congestion.   Respiratory: Positive for cough (hx of bronchitis).   Musculoskeletal: Positive for back pain (suspect this is due to mattress).  - denies chest pain, denies SOB, no nausea or vomiting , no tremors   Blood pressure 139/90, pulse 92, temperature 98.3 F (36.8 C), temperature source Oral, resp. rate 18, height _0  (1.778 m), weight 135.172 kg (298 lb).Body mass index is 42.76 kg/(m^2).  General Appearance: improved grooming  Eye Contact::  Good  Speech:  Normal Rate  Volume:  Normal  Mood:   Better, not  depressed , today seems euthymic   Affect: fuller in range, not irritable or expansive at this time  Thought Process:  Linear  Orientation:  Full (Time, Place, and Person)  Thought Content:  denies hallucinations, no delusions expressed, not internally preoccupied -  At this time future oriented, and more optimistic about discharge   Suicidal Thoughts:  No- at this time denies any thoughts of  hurting self or of SI.    Homicidal Thoughts:  No- at this time denies plan or intention of hurting anyone, to include any plan or intention of violence towards his boss/employer   Memory:  recent and remote grossly intact  Judgement:  Other:  improving  Insight:  improving   Psychomotor Activity:  Normal- no agitation or restlessness  Concentration:  Good  Recall:  Good  Fund of Knowledge: Good  Language: Good  Akathisia:  Negative  Handed:  Right  AIMS (if indicated):     Assets:  Communication Skills Desire for Improvement Resilience  ADL's:   Fair- improving    Cognition: WNL  Sleep:  Number of Hours: 6.3     Current Medications: Current Facility-Administered Medications  Medication Dose Route Frequency Provider Last Rate Last Dose  . acetaminophen (TYLENOL) tablet 650 mg  650 mg Oral Q6H PRN Delfin Gant, NP      . alum & mag hydroxide-simeth (MAALOX/MYLANTA) 200-200-20  MG/5ML suspension 30 mL  30 mL Oral Q4H PRN Delfin Gant, NP      . ARIPiprazole (ABILIFY) tablet 5 mg  5 mg Oral Daily Nicholaus Bloom, MD   5 mg at 07/23/15 0744  . divalproex (DEPAKOTE ER) 24 hr tablet 1,000 mg  1,000 mg Oral QHS Nicholaus Bloom, MD   1,000 mg at 07/22/15 2204  . gabapentin (NEURONTIN) capsule 300 mg  300 mg Oral TID Nicholaus Bloom, MD   300 mg at 07/23/15 1134  . hydrOXYzine (ATARAX/VISTARIL) tablet 50 mg  50 mg Oral TID PRN Delfin Gant, NP      . lisinopril (PRINIVIL,ZESTRIL) tablet 10 mg  10 mg Oral Daily Delfin Gant, NP   10 mg at 07/23/15 0744  . magnesium hydroxide (MILK OF MAGNESIA) suspension 30 mL  30 mL Oral Daily PRN Delfin Gant, NP      . nicotine (NICODERM CQ - dosed in mg/24 hours) patch 21 mg  21 mg Transdermal Daily Jenne Campus, MD   21 mg at 07/23/15 0744  . traZODone (DESYREL) tablet 200 mg  200 mg Oral QHS Nicholaus Bloom, MD   200 mg at 07/22/15 2203   Lab Results:  Results for orders placed or performed during the hospital encounter of 07/21/15 (from the past 48 hour(s))  TSH     Status: None   Collection Time: 07/22/15  6:24 PM  Result Value Ref Range   TSH 1.313 0.350 - 4.500 uIU/mL    Comment: Performed at [facility]   Physical Findings: AIMS:  , ,  ,  ,    CIWA:    COWS:      Assessment - Patient is improving- at this time, describes mood as getting better and today seems euthymic. He is not presenting with symptoms of mania.  He is more future oriented and clearly less apprehensive about discharge planning, denies any side effects on meds.   .  Treatment Plan Summary: Daily contact with patient to assess and evaluate symptoms and progress in treatment, Medication management, Plan inpatient admission and medications as below  Increase Depakote ER 1000 mgrs QHS for mood stabilization, explosiveness.  Continue Abilify 5 mg for mood control  Continue Neurontin 300 mgrs BID  to address  anxiety/agitation. Decrease Trazodone down to 150 mg QHS for insomnia. Continue Nicoderm patch to manage nicotine cravings Continue Vistaril 50 mg Q 6 hours PRN for Anxiety. Encourage group/ milieu  participation, to address coping skills, improve mood  Treatment team working on disposition options - as noted, patient more focused on disposition planning at this time  Encarnacion Slates, PMHNP, FNP-BC 07/23/2015, 2:18 PM I agree with assessment and plan Geralyn Flash A. Sabra Heck, M.D.

## 2015-07-23 NOTE — [facility] Group Notes (Signed)
[facility] Group Notes:  (Nursing/MHT/Case Management/Adjunct)  Date:  07/23/2015  Time:  11:04 AM  Type of Therapy:  Psychoeducational Skills  Participation Level:  Did Not Attend  Participation Quality:  Did Not Attend  Affect:  Did Not Attend  Cognitive:  Did Not Attend  Insight:  None  Engagement in Group:  Did Not Attend  Modes of Intervention:  Did Not Attend  Summary of Progress/Problems: Pt did not attend patient self inventory group.   Jacquelyne BalintForrest, Gertude Benito Shanta 07/23/2015, 11:04 AM

## 2015-07-23 NOTE — Progress Notes (Signed)
Patient has been observed up in the dayroom laughing and talking loudly with select peers. He attended group and was compliant with scheduled medications. Writer inquired about his goal for discharge but he reported that he does not have one just yet. Encouraged patient to think of something he would like to work towards before discharge. Suppport given and safety maintained on unit with 15 min checks.

## 2015-07-23 NOTE — Progress Notes (Signed)
Patient ID: Blake Petersen, male   DOB: 1990/11/25, 24 y.o.   MRN: [id number]   D: Pt has been very rude and disrespectful on the unit today. Pt requires frequent redirect regarding the use of profanity, pt does not respond well to redirection. Pt refused to fill out his pt self inventory sheet, he reported that he was over it. Pt did attend groups, however remained very disrespectful and disruptive. Pt reported being negative SI/HI, no AH/VH noted. A: 15 min checks continued for patient safety. R: Pt safety maintained.

## 2015-07-24 MED ORDER — ARIPIPRAZOLE 5 MG PO TABS: 5.0000 mg | ORAL_TABLET | Freq: Every day | ORAL | Status: AC

## 2015-07-24 MED ORDER — TRAZODONE HCL 150 MG PO TABS: 150.0000 mg | ORAL_TABLET | Freq: Every day | ORAL | Status: AC

## 2015-07-24 MED ORDER — NICOTINE 21 MG/24HR TD PT24: 21.0000 mg | MEDICATED_PATCH | Freq: Every day | TRANSDERMAL | Status: AC

## 2015-07-24 MED ORDER — HYDROXYZINE HCL 50 MG PO TABS: 50.0000 mg | ORAL_TABLET | Freq: Three times a day (TID) | ORAL | Status: AC | PRN

## 2015-07-24 MED ORDER — GABAPENTIN 300 MG PO CAPS: 300.0000 mg | ORAL_CAPSULE | Freq: Three times a day (TID) | ORAL | Status: AC

## 2015-07-24 MED ORDER — LISINOPRIL 10 MG PO TABS: 10.0000 mg | ORAL_TABLET | Freq: Every day | ORAL | Status: AC

## 2015-07-24 MED ORDER — DIVALPROEX SODIUM ER 500 MG PO TB24: 1000.0000 mg | ORAL_TABLET | Freq: Every day | ORAL | Status: AC

## 2015-07-24 NOTE — Discharge Summary (Signed)
Physician Discharge Summary Note  Patient:  Blake Petersen is an 24 y.o., male MRN:  [id number] DOB:  Apr 19, 1991 Patient phone:  [phone] (home)  Patient address:   [address]  [place] [address],  Total Time spent with patient: 45 minutes  Date of Admission:  07/21/2015 Date of Discharge: 07/24/2015  Reason for Admission:PER HPI  Jonas is a 24 year old AA male, admitted to [facility] from the [facility] with complaints of suicide thoughts & homicidal ideations towards his childrens' grand-mother. Kaydenn was a patient in this hospital last September for anger issues associated with Bipolar disorder. He was discharged to follow-up care at [facility]. This time around, he reports, "The [facility] police escorted me to the hospital. I was feeling suicidal & homicidal towards my kid's grandmother. I attempted to cut myself, but the knife was dull. My kid's grandmother called & told me that I could not see my kids no more. I got mad & developed bad thoughts. I threatened to kill her. I have not been on my medicines for 2 months since I left this hospital in September. I was discharged to follow-up care at [facility], but did not make the appointment. Besides, the Prozac pill did not help me no way. I'm no longer homicidal, but suicidal".  Principal Problem: Bipolar 1 disorder, mixed, moderate (HCC) Discharge Diagnoses: Patient Active Problem List   Diagnosis Date Noted  . Bipolar 1 disorder, mixed, moderate (HCC) [F31.62] 07/22/2015    Past Psychiatric History: Bipolar Disorder 1 mixed  Past Medical History:  Past Medical History  Diagnosis Date  . Bipolar 1 disorder (HCC)   . Asthma     Past Surgical History  Procedure Laterality Date  . Hand surgery    . Knee arthroscopy     Family History: History reviewed. No pertinent family history. Family Psychiatric  History: SEE SRA Social History:  History  Alcohol Use  . Yes    Comment: occ     History  Drug Use  . Yes  .  Special: Marijuana    Social History   Social History  . Marital Status: Single    Spouse Name: N/A  . Number of Children: N/A  . Years of Education: N/A   Social History Main Topics  . Smoking status: Current Every Day Smoker -- 1.50 packs/day    Types: Cigarettes  . Smokeless tobacco: None  . Alcohol Use: Yes     Comment: occ  . Drug Use: Yes    Special: Marijuana  . Sexual Activity: No   Other Topics Concern  . None   Social History Narrative    Hospital Course:  Rahim Chlebowski was admitted for Bipolar 1 disorder, mixed, moderate (HCC) , with psychosis and crisis management.  Pt was treated discharged with the medications listed below under Medication List.  Medical problems were identified and treated as needed.  Home medications were restarted as appropriate.  Improvement was monitored by observation and Mattthew Cichy 's daily report of symptom reduction.  Emotional and mental status was monitored by daily self-inventory reports completed by [facility] and clinical staff.         Lio Devers was evaluated by the treatment team for stability and plans for continued recovery upon discharge. Aaron Mcnicholas 's motivation was an integral factor for scheduling further treatment. Employment, transportation, bed availability, health status, family support, and any pending legal issues were also considered during hospital stay. Pt was offered further treatment options upon discharge including  but not limited to Residential, Intensive Outpatient, and Outpatient treatment.  Tipton Royster will follow up with the services as listed below under Follow Up Information.     Upon completion of this admission the patient was both mentally and medically stable for discharge denying suicidal/homicidal ideation, auditory/visual/tactile hallucinations, delusional thoughts and paranoia.    Family session went well. No seclusion or restraint.  Cadden Kohls responded well to treatment with , Abilify ,  Depakote 1000 mg, and Neurontin 300 mg without adverse effects. Initially, pt did not complain of  Side effects with these medications, but this resolved. Pt demonstrated improvement without reported or observed adverse effects to the point of stability appropriate for outpatient management. Pertinent labs include: CBC: RBC 4.21(L), Hemoglobin 12.3 (l) , HCT 36.4 (l), for which outpatient follow-up is necessary for lab recheck as mentioned below. Reviewed CBC, CMP, BAL, and UDS; all unremarkable aside from noted exceptions.   Physical Findings: AIMS:  , ,  ,  ,    CIWA:    COWS:     Musculoskeletal: Strength & Muscle Tone: within normal limits Gait & Station: normal Patient leans: N/A  Psychiatric Specialty Exam: SEE SRA BY MD Review of Systems  Psychiatric/Behavioral: Negative for suicidal ideas. Depression: stable. Nervous/anxious: stable.   All other systems reviewed and are negative.   Blood pressure 139/90, pulse 92, temperature 98.3 F (36.8 C), temperature source Oral, resp. rate 18, height  (1.778 m), weight 135.172 kg (298 lb).Body mass index is 42.76 kg/(m^2).  Have you used any form of tobacco in the last 30 days? (Cigarettes, Smokeless Tobacco, Cigars, and/or Pipes): Patient Refused Screening  Has this patient used any form of tobacco in the last 30 days? (Cigarettes, Smokeless Tobacco, Cigars, and/or Pipes) Yes, Yes, A prescription for an FDA-approved tobacco cessation medication was offered at discharge and the patient refused  Metabolic Disorder Labs:  Lab Results  Component Value Date   HGBA1C 6.2* 05/19/2015   MPG 131 05/19/2015   No results found for: PROLACTIN Lab Results  Component Value Date   CHOL 196 05/19/2015   TRIG 206* 05/19/2015   HDL 26* 05/19/2015   CHOLHDL 7.5 05/19/2015   VLDL 41* 05/19/2015   LDLCALC 129* 05/19/2015    See Psychiatric Specialty Exam and Suicide Risk Assessment completed by Attending Physician prior to  discharge.  Discharge destination:  Home  Is patient on multiple antipsychotic therapies at discharge:  No   Has Patient had three or more failed trials of antipsychotic monotherapy by history:  No  Recommended Plan for Multiple Antipsychotic Therapies: NA      Discharge Instructions    Activity as tolerated - No restrictions    Complete by:  As directed      Diet general    Complete by:  As directed      Discharge instructions    Complete by:  As directed   Amine Axtell has been instructed to take medications as prescribed; and report adverse effects to outpatient provider.  Follow up with primary doctor for any medical issues and If symptoms recur report to nearest emergency or crisis hot line.            Medication List    STOP taking these medications        FLUoxetine 20 MG capsule  Commonly known as:  PROZAC     ibuprofen 200 MG tablet  Commonly known as:  ADVIL,MOTRIN     meloxicam 15 MG tablet  Commonly known  as:  MOBIC      TAKE these medications      Indication   ARIPiprazole 5 MG tablet  Commonly known as:  ABILIFY  Take 1 tablet (5 mg total) by mouth daily.   Indication:  mood stabilization     divalproex 500 MG 24 hr tablet  Commonly known as:  DEPAKOTE ER  Take 2 tablets (1,000 mg total) by mouth at bedtime.   Indication:  mood stabilization     gabapentin 300 MG capsule  Commonly known as:  NEURONTIN  Take 1 capsule (300 mg total) by mouth 3 (three) times daily.   Indication:  Aggressive Behavior, Agitation     hydrOXYzine 50 MG tablet  Commonly known as:  ATARAX/VISTARIL  Take 1 tablet (50 mg total) by mouth 3 (three) times daily as needed for anxiety.   Indication:  anxiety     lisinopril 10 MG tablet  Commonly known as:  PRINIVIL,ZESTRIL  Take 1 tablet (10 mg total) by mouth daily.   Indication:  High Blood Pressure     nicotine 21 mg/24hr patch  Commonly known as:  NICODERM CQ - dosed in mg/24 hours  Place 1 patch (21 mg total)  onto the skin daily.   Indication:  Nicotine Addiction     traZODone 150 MG tablet  Commonly known as:  DESYREL  Take 1 tablet (150 mg total) by mouth at bedtime.   Indication:  Trouble Sleeping       Follow-up Information    [facility] to [facility].   Why:  Please [facility] up with [facility] for admittance, in-person   Contact information:   [address]  [place], [place] [phone]       Follow-up recommendations:  Activity:  as tolerated Diet:  heart healthy  Comments:  Take all of you medications as prescribed by your mental healthcare provider.  Report any adverse effects and reactions from your medications to your outpatient provider promptly.  Do not engage in alcohol and or illegal drug use while on prescription medicines. Keep all scheduled appointments. This is to ensure that you are getting refills on time and to avoid any interruption in your medication.  If you are unable to keep an appointment call to reschedule.  Be sure to follow up with resources and follow ups given. In the event of worsening symptoms call the crisis hotline, 911, and or go to the nearest emergency department for appropriate evaluation and treatment of symptoms. Follow-up with your primary care provider for your medical issues, concerns and or health care needs.   Signed: Oneta Rackanika N Lewis  FNP- [facility]  07/24/2015, 11:24 AM   Patient seen, Suicide Assessment Completed.  Disposition Plan Reviewed

## 2015-07-24 NOTE — [facility] Suicide Risk Assessment (Signed)
[facility]   Demographic Factors:  24 year old  Single male   Total Time spent with patient: 30 minutes  Musculoskeletal: Strength & Muscle Tone: within normal limits Gait & Station: normal Patient leans: N/A  Psychiatric Specialty Exam: Physical Exam  ROS  Blood pressure 139/90, pulse 92, temperature 98.3 F (36.8 C), temperature source Oral, resp. rate 18, height 5\' 10"  (1.778 m), weight 298 lb (135.172 kg).Body mass index is 42.76 kg/(m^2).  General Appearance: Well Groomed  Eye Contact::  Good  Speech:  Normal Rate409  Volume:  Normal  Mood:  improved, at this time states mood is "OK"  Affect:  Appropriate and reactive   Thought Process:  Linear  Orientation:  Full (Time, Place, and Person)  Thought Content:  denies hallucinations, no delusions  Suicidal Thoughts:  No- at present denies any suicidal ideations, also denies any self injurious ideations  Homicidal Thoughts:  No denies any thoughts of violence or HI towards anyone.   Memory:  recent and remote grossly intact   Judgement:  Other:  improved  Insight:  improving   Psychomotor Activity:  Normal  Concentration:  Good  Recall:  Good  Fund of Knowledge:Good  Language: Good  Akathisia:  Negative  Handed:  Right  AIMS (if indicated):     Assets:  Communication Skills Desire for Improvement Resilience  Sleep:  Number of Hours: 6.75  Cognition: WNL  ADL's:  Intact   Have you used any form of tobacco in the last 30 days? (Cigarettes, Smokeless Tobacco, Cigars, and/or Pipes): Patient Refused Screening  Has this patient used any form of tobacco in the last 30 days? (Cigarettes, Smokeless Tobacco, Cigars, and/or Pipes) Yes, A prescription for an FDA-approved tobacco cessation medication was offered at discharge and the patient refused  Mental Status Per Nursing Assessment::   On Admission:  NA  Current Mental Status by Physician: At this time patient is improved compared to admission-  currently describes mood as improved, affect is reactive, full in range, no thought disorder, denies any suicidal ideations at this time, denies any HI, no psychotic symptoms, future oriented .  Loss Factors:  unemployed at this time, little support network  Historical Factors: History of Mood Disorder, has been diagnosed with Bipolar Disorder in the past, history of explosiveness   Risk Reduction Factors:   Sense of responsibility to family and Positive coping skills or problem solving skills  Continued Clinical Symptoms:  As noted, currently improved compared to admission- currently mood improved, affect fuller in range, no suicidal ideations, no homicidal ideations, no psychotic symptoms and future oriented.  Cognitive Features That Contribute To Risk:  No gross cognitive deficits noted upon discharge. Is alert , attentive, and oriented x 3   Suicide Risk:  Mild:  Suicidal ideation of limited frequency, intensity, duration, and specificity.  There are no identifiable plans, no associated intent, mild dysphoria and related symptoms, good self-control (both objective and subjective assessment), few other risk factors, and identifiable protective factors, including available and accessible social support.  Principal Problem:  Bipolar Disorder by history Discharge Diagnoses:  Patient Active Problem List   Diagnosis Date Noted  . Bipolar 1 disorder, mixed, moderate (HCC) [F31.62] 07/22/2015    Follow-up Information    [facility] to [facility].   Why:  Please [facility] with the [facility] for admittance, in-person   Contact information:   [address]  [place] [phone]  Plan Of Care/Follow-up recommendations:  Activity:  as tolerated  Diet:  Regular Tests:  NA Other:  see below   Is patient on multiple antipsychotic therapies at discharge:  No   Has Patient had three or more failed trials of antipsychotic monotherapy by history:  No  Recommended  Plan for Multiple Antipsychotic Therapies: NA  Patient is leaving unit in good spirits/ euthymic on discharge . Plans to  Go to [facility] as above .    Blake Petersen 07/24/2015, 11:22 AM

## 2015-07-24 NOTE — Progress Notes (Signed)
D.Pt has been up and minimally visible in milieu this evening, pt spoke briefly of his day and spoke about how he was feeling tired tonight and wanted to get a good night sleep. Pt did receive medications without incident and retired to his room shortly evening group activity. A. Support and encouragement provided. R. Safety maintained, will continue to monitor.
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
## 2015-07-24 NOTE — Progress Notes (Signed)
Discharge note: Pt received both written and verbal discharge instructions. Pt verbalized understanding of discharge instructions. Pt agreed to f/u appt and med regimen. Pt denies suicidal thoughts at time of discharge. Pt received sample meds, prescriptions, fare for transport to [facility] and belongings. Pt safely left [facility].

## 2015-07-24 NOTE — Progress Notes (Signed)
Patient is resting in bed with eyes closed. Respirations are even and non labored. No distress noted. Q 15 minute checks in progress and maintained for safety and monitoring continues.  

## 2015-07-24 NOTE — Progress Notes (Signed)
  [facility] Discharge Plan :  Will you be returning to the same living situation after discharge:  No. At discharge, do you have transportation home?: Yes,  pt plans to take a bus Do you have the ability to pay for your medications: Yes,  pt will be provided with prescriptions at discharge  Release of information consent forms completed and in the chart;  Patient's signature needed at discharge.  Patient to Follow up at:  Pt refused outpatient treatment services upon discharge, but plans to go to the [facility] [place]   Next level of care provider has access to [facility] Link:no  Patient denies SI/HI: Yes,  denies    Aeronautical engineerafety Planning and Suicide Prevention discussed: Yes, with pt  Have you used any form of tobacco in the last 30 days? (Cigarettes, Smokeless Tobacco, Cigars, and/or Pipes): Patient Refused Screening  Has patient been referred to the Quitline?: Patient refused referral  Mercy RidingJonathan F Edrian Melucci 07/24/2015, 12:07 PM

## 2016-06-16 IMAGING — CR DG CHEST 2V
2 series · 2 of 2 positions shown · non-contrast
Comparison: Radiograph dated 05/03/2015

CLINICAL DATA: 24-year-old male with chest pain and cough

EXAM:
CHEST  2 VIEW

[chest pa]
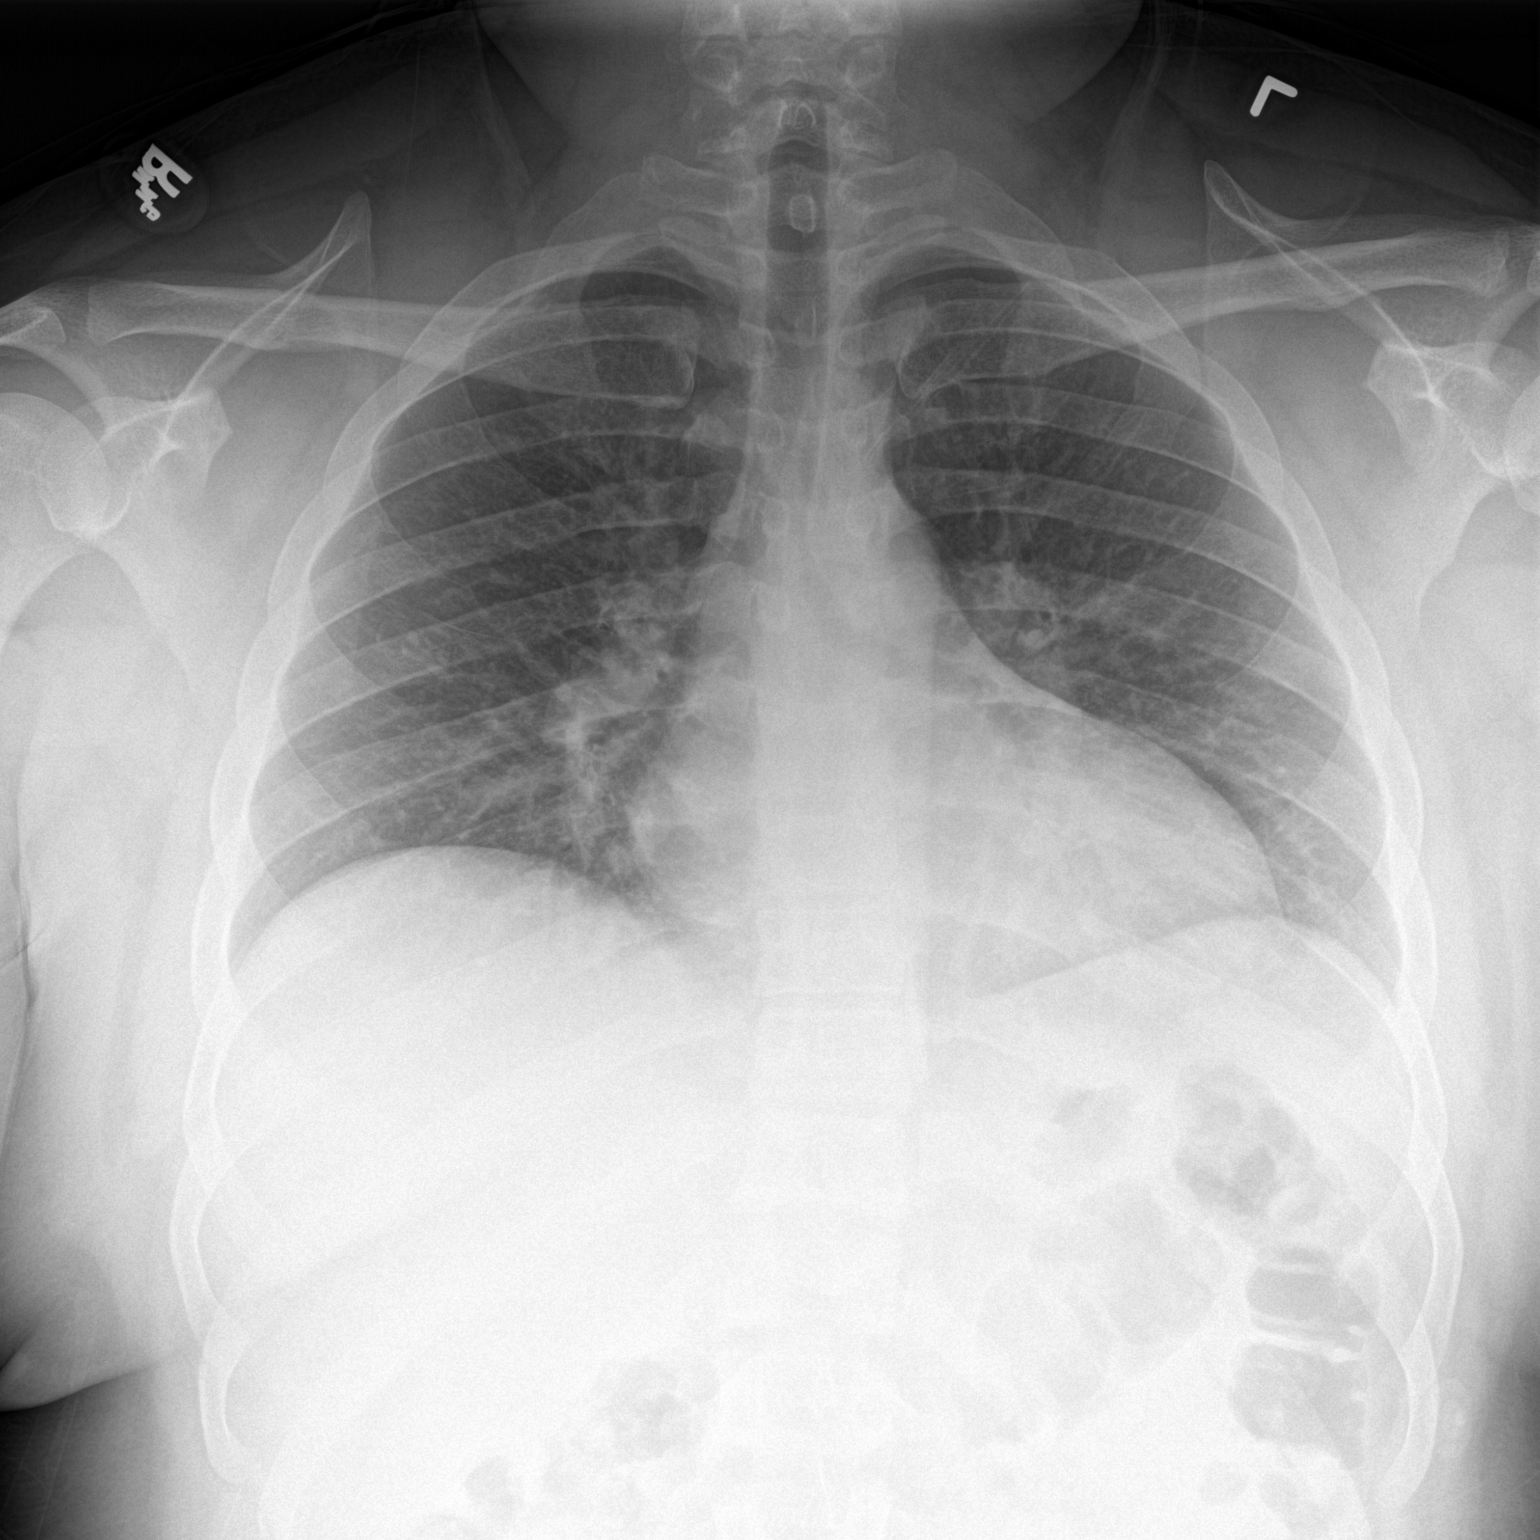

[chest lat]
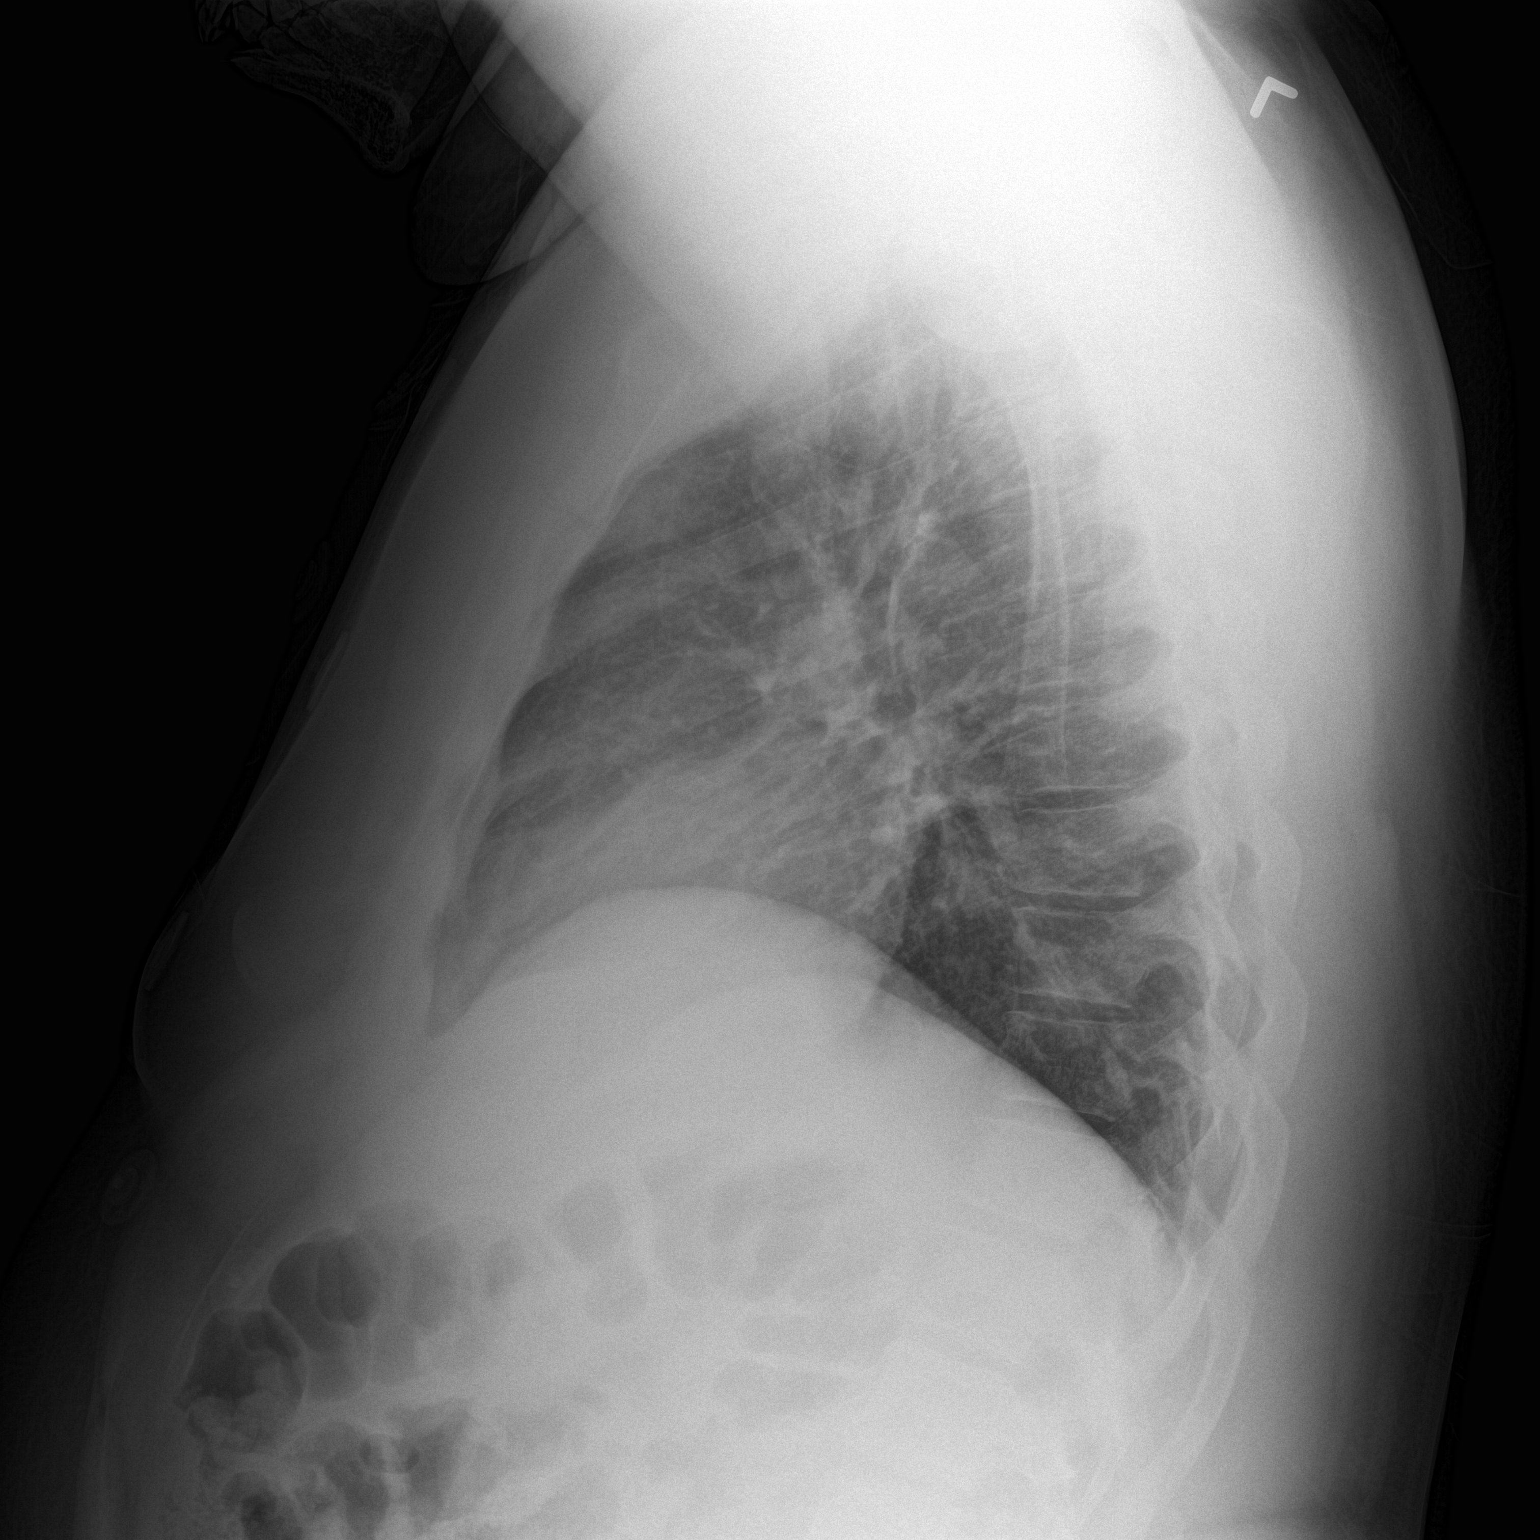

[2 of 2 positions shown; findings below may reference images not displayed]

FINDINGS: The heart size and mediastinal contours are within normal limits.
Both lungs are clear. The visualized skeletal structures are
unremarkable.
IMPRESSION: No active cardiopulmonary disease.
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
## 2016-12-03 ENCOUNTER — Emergency Department: Admit: 2016-12-03 | Payer: Self-pay

## 2016-12-03 ENCOUNTER — Inpatient Hospital Stay
Admit: 2016-12-03 | Discharge: 2016-12-04 | Disposition: A | Discharge status: Other Acute Inpatient Hospital | Payer: Self-pay | Attending: Emergency Medicine

## 2016-12-03 ENCOUNTER — Emergency Department

## 2016-12-03 DIAGNOSIS — F3160 Bipolar disorder, current episode mixed, unspecified: Secondary | ICD-10-CM

## 2016-12-03 LAB — CBC W/O DIFF
ABSOLUTE NRBC: 0 10*3/uL (ref 0.00–0.01)
HCT: 35.8 % — ABNORMAL LOW (ref 36.6–50.3)
HGB: 12.1 g/dL (ref 12.1–17.0)
MCH: 28.9 PG (ref 26.0–34.0)
MCHC: 33.8 g/dL (ref 30.0–36.5)
MCV: 85.6 FL (ref 80.0–99.0)
MPV: 9.2 FL (ref 8.9–12.9)
NRBC: 0 PER 100 WBC
PLATELET: 349 10*3/uL (ref 150–400)
RBC: 4.18 M/uL (ref 4.10–5.70)
RDW: 14.5 % (ref 11.5–14.5)
WBC: 8.2 10*3/uL (ref 4.1–11.1)

## 2016-12-03 LAB — DRUG SCREEN, URINE
AMPHETAMINES: NEGATIVE
BARBITURATES: NEGATIVE
BENZODIAZEPINES: NEGATIVE
COCAINE: NEGATIVE
METHADONE: NEGATIVE
OPIATES: NEGATIVE
PCP(PHENCYCLIDINE): NEGATIVE
THC (TH-CANNABINOL): NEGATIVE

## 2016-12-03 LAB — URINALYSIS W/ REFLEX CULTURE
Bilirubin: NEGATIVE
Blood: NEGATIVE
Glucose: NEGATIVE mg/dL
Ketone: NEGATIVE mg/dL
Nitrites: NEGATIVE
Protein: 100 mg/dL — AB
Specific gravity: 1.027 (ref 1.003–1.030)
Urobilinogen: 1 EU/dL (ref 0.2–1.0)
pH (UA): 8 (ref 5.0–8.0)

## 2016-12-03 LAB — METABOLIC PANEL, COMPREHENSIVE
A-G Ratio: 0.8 — ABNORMAL LOW (ref 1.1–2.2)
ALT (SGPT): 25 U/L (ref 12–78)
AST (SGOT): 18 U/L (ref 15–37)
Albumin: 3.5 g/dL (ref 3.5–5.0)
Alk. phosphatase: 77 U/L (ref 45–117)
Anion gap: 8 mmol/L (ref 5–15)
BUN/Creatinine ratio: 14 (ref 12–20)
BUN: 12 MG/DL (ref 6–20)
Bilirubin, total: 0.2 MG/DL (ref 0.2–1.0)
CO2: 27 mmol/L (ref 21–32)
Calcium: 9.3 MG/DL (ref 8.5–10.1)
Chloride: 106 mmol/L (ref 97–108)
Creatinine: 0.87 MG/DL (ref 0.70–1.30)
GFR est AA: >60 mL/min/{1.73_m2} (ref >60)
GFR est non-AA: >60 mL/min/{1.73_m2} (ref >60)
Globulin: 4.3 g/dL — ABNORMAL HIGH (ref 2.0–4.0)
Glucose: 160 mg/dL — ABNORMAL HIGH (ref 65–100)
Potassium: 3.7 mmol/L (ref 3.5–5.1)
Protein, total: 7.8 g/dL (ref 6.4–8.2)
Sodium: 141 mmol/L (ref 136–145)

## 2016-12-03 LAB — ETHYL ALCOHOL: ALCOHOL(ETHYL),SERUM: <10 MG/DL (ref <10)

## 2016-12-03 MED ORDER — QUETIAPINE 25 MG TAB
25 mg | ORAL | Status: AC
Start: 2016-12-03 — End: 2016-12-03
  Administered 2016-12-04: via ORAL

## 2016-12-03 MED ORDER — DIVALPROEX 250 MG TAB, DELAYED RELEASE
250 mg | ORAL | Status: AC
Start: 2016-12-03 — End: 2016-12-03
  Administered 2016-12-04: via ORAL

## 2016-12-03 MED ORDER — RISPERIDONE 1 MG TAB
1 mg | ORAL | Status: AC
Start: 2016-12-03 — End: 2016-12-03
  Administered 2016-12-04: via ORAL

## 2016-12-03 MED ORDER — ACETAMINOPHEN 325 MG TABLET
325 mg | ORAL | Status: AC
Start: 2016-12-03 — End: 2016-12-03
  Administered 2016-12-03: 23:00:00 via ORAL

## 2016-12-03 MED FILL — MAPAP (ACETAMINOPHEN) 325 MG TABLET: 325 mg | ORAL | Qty: 2
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
## 2016-12-03 NOTE — ED Notes (Signed)
Face washed; dried blood from left ear cleaned.  Small superficial lac noted, approx 3mm, no bleeding at this time.  Ice pack provided.
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
## 2016-12-03 NOTE — ED Notes (Signed)
Bedside shift change report given to Essence RN  (oncoming nurse) by Bjorn Loser RN (offgoing nurse). Report included the following information ED Summary and MAR.     [facility] eval in process; Forensics paged

## 2016-12-03 NOTE — ED Notes (Signed)
Case discussed with Raynelle Fanning in [place].  Phone transferred to pt room.

## 2016-12-03 NOTE — ED Notes (Addendum)
Transported to CT via wheelchair accompanied by ED tech for safety

## 2016-12-03 NOTE — ED Notes (Signed)
TRANSFER - OUT REPORT:    Verbal report given to Blanchie Dessert, RN(name) on Casey Lewis  being transferred to [facility] (General Unit) (unit) for urgent transfer       Report consisted of patient???s Situation, Background, Assessment and   Recommendations(SBAR).     Information from the following report(s) SBAR, Kardex, ED Summary, Intake/Output, MAR and Recent Results was reviewed with the receiving nurse.    Lines:       Opportunity for questions and clarification was provided.      Patient transported with:  [facility]    Pt cleared from [facility].

## 2016-12-03 NOTE — ED Notes (Signed)
Leisure centre manager, Raynelle Fanning, spoke with pt on phone; will come to bedside to evaluate, eta 20-3- minutes

## 2016-12-03 NOTE — ED Notes (Signed)
Bedside and Verbal shift change report given to Essence,RN (oncoming nurse) by Macon Large (offgoing nurse). Report included the following information SBAR, ED Summary, Intake/Output, MAR and Recent Results. Remains on 1:1 for safety.

## 2016-12-03 NOTE — Other (Signed)
Patient asked about food and forensics. Informed patient that nurse would be asked about these requests.    Melissa Schall LPC [facility]
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
## 2016-12-04 ENCOUNTER — Inpatient Hospital Stay
Admit: 2016-12-04 | Discharge: 2016-12-04 | Disposition: A | Discharge status: Home / Self Care | Payer: Self-pay | Source: Other Acute Inpatient Hospital | Attending: Psychiatry | Admitting: Psychiatry

## 2016-12-04 DIAGNOSIS — F4320 Adjustment disorder, unspecified: Secondary | ICD-10-CM

## 2016-12-04 MED ORDER — BENZTROPINE 2 MG TAB
2 mg | Freq: Two times a day (BID) | ORAL | Status: DC | PRN
Start: 2016-12-04 — End: 2016-12-04

## 2016-12-04 MED ORDER — BENZTROPINE 1 MG/ML IJ SOLN
1 mg/mL | Freq: Two times a day (BID) | INTRAMUSCULAR | Status: DC | PRN
Start: 2016-12-04 — End: 2016-12-04

## 2016-12-04 MED ORDER — WATER FOR INJECTION, STERILE INJECTION
20 mg/mL (final conc.) | Freq: Two times a day (BID) | INTRAMUSCULAR | Status: DC | PRN
Start: 2016-12-04 — End: 2016-12-04

## 2016-12-04 MED ORDER — LORAZEPAM 2 MG/ML IJ SOLN
2 mg/mL | INTRAMUSCULAR | Status: DC | PRN
Start: 2016-12-04 — End: 2016-12-04

## 2016-12-04 MED ORDER — IBUPROFEN 400 MG TAB
400 mg | Freq: Three times a day (TID) | ORAL | Status: DC | PRN
Start: 2016-12-04 — End: 2016-12-04

## 2016-12-04 MED ORDER — NICOTINE 21 MG/24 HR DAILY PATCH
21 mg/24 hr | Freq: Every day | TRANSDERMAL | Status: DC | PRN
Start: 2016-12-04 — End: 2016-12-04

## 2016-12-04 MED ORDER — MAGNESIUM HYDROXIDE 400 MG/5 ML ORAL SUSP
400 mg/5 mL | Freq: Every day | ORAL | Status: DC | PRN
Start: 2016-12-04 — End: 2016-12-04

## 2016-12-04 MED ORDER — ACETAMINOPHEN 325 MG TABLET
325 mg | ORAL | Status: DC | PRN
Start: 2016-12-04 — End: 2016-12-04

## 2016-12-04 MED ORDER — LORAZEPAM 1 MG TAB
1 mg | ORAL | Status: DC | PRN
Start: 2016-12-04 — End: 2016-12-04

## 2016-12-04 MED ORDER — ZOLPIDEM 10 MG TAB
10 mg | Freq: Every evening | ORAL | Status: DC | PRN
Start: 2016-12-04 — End: 2016-12-04

## 2016-12-04 MED ORDER — OLANZAPINE 5 MG TAB
5 mg | Freq: Four times a day (QID) | ORAL | Status: DC | PRN
Start: 2016-12-04 — End: 2016-12-04

## 2016-12-04 MED FILL — DEPAKOTE 250 MG TABLET,DELAYED RELEASE: 250 mg | ORAL | Qty: 2

## 2016-12-04 MED FILL — QUETIAPINE 25 MG TAB: 25 mg | ORAL | Qty: 1

## 2016-12-04 MED FILL — RISPERIDONE 1 MG TAB: 1 mg | ORAL | Qty: 2

## 2016-12-04 NOTE — Discharge Summary (Signed)
Psychiatric evaluation and discharge summary completed "all in one" due to the patient being discharged the same day as seen by writer for the initial evaluation. Please see this report for full details. Patient stable for discharge and considered to be at low risk of harm to self or others. Pt denies s/i and h/i. Pt fully contracts for safety. Pt reports many positive predictive factors in terms of not attempting suicide or homicide. Informed consent given to the use of discharge medications.  Treatment rounds held in full. All those in treatment rounds concur with plans for discharge today.
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
## 2016-12-04 NOTE — Behavioral Health Treatment Team (Signed)
Discharge instructions given and explained and the patient verbalized an understanding.Staff verified the instructions.The patient had no home medications to be returned.valuables and all other belongings were returned upon discharge.Staff took patient off the unit.

## 2016-12-04 NOTE — ED Notes (Signed)
[facility] here. MD Wendie Simmer completed last look. EMTALA completed. Face sheet, encounter summary, XR and CT readout given, PCS form, and completed EMTALA provided to transport team. Pt left our ED without IV in place accompanied by transport team. Belongings out of ED with patient & transport team.  Patient out of ED prior to obtaining discharge vitals.

## 2016-12-05 LAB — CULTURE, URINE
Colonies Counted: >100000
Colony Count: >100000

## 2022-09-19 ENCOUNTER — Emergency Department
Admission: EM | Admit: 2022-09-19 | Discharge: 2022-09-20 | Disposition: A | Discharge status: Home / Self Care | Payer: Self-pay | Attending: Emergency Medicine | Admitting: Emergency Medicine

## 2022-09-19 DIAGNOSIS — R9431 Abnormal electrocardiogram [ECG] [EKG]: Secondary | ICD-10-CM

## 2022-09-19 DIAGNOSIS — R059 Cough, unspecified: Secondary | ICD-10-CM | POA: Insufficient documentation

## 2022-09-19 DIAGNOSIS — R079 Chest pain, unspecified: Secondary | ICD-10-CM | POA: Insufficient documentation

## 2022-09-19 DIAGNOSIS — E1165 Type 2 diabetes mellitus with hyperglycemia: Secondary | ICD-10-CM | POA: Insufficient documentation

## 2022-09-19 HISTORY — DX: Type 2 diabetes mellitus without complications: E11.9

## 2022-09-19 LAB — CBC AND DIFFERENTIAL
Absolute NRBC: 0 10*3/uL (ref 0.00–0.00)
Basophils Absolute Automated: 0.03 10*3/uL (ref 0.00–0.08)
Basophils Automated: 0.6 %
Eosinophils Absolute Automated: 0.1 10*3/uL (ref 0.00–0.44)
Eosinophils Automated: 1.9 %
Hematocrit: 38.5 % (ref 37.6–49.6)
Hgb: 14.2 g/dL (ref 12.5–17.1)
Immature Granulocytes Absolute: 0.02 10*3/uL (ref 0.00–0.07)
Immature Granulocytes: 0.4 %
Instrument Absolute Neutrophil Count: 2.08 10*3/uL (ref 1.10–6.33)
Lymphocytes Absolute Automated: 2.53 10*3/uL (ref 0.42–3.22)
Lymphocytes Automated: 49.2 %
MCH: 29.2 pg (ref 25.1–33.5)
MCHC: 36.9 g/dL — ABNORMAL HIGH (ref 31.5–35.8)
MCV: 79.1 fL (ref 78.0–96.0)
MPV: 10.2 fL (ref 8.9–12.5)
Monocytes Absolute Automated: 0.38 10*3/uL (ref 0.21–0.85)
Monocytes: 7.4 %
Neutrophils Absolute: 2.08 10*3/uL (ref 1.10–6.33)
Neutrophils: 40.5 %
Nucleated RBC: 0 /100 WBC (ref 0.0–0.0)
Platelets: 387 10*3/uL — ABNORMAL HIGH (ref 142–346)
RBC: 4.87 10*6/uL (ref 4.20–5.90)
RDW: 13 % (ref 11–15)
WBC: 5.14 10*3/uL (ref 3.10–9.50)

## 2022-09-19 LAB — VENOUS CG4 POCT
i-STAT HCO3 Bicarbonate Venous: 22.9 mEq/L — ABNORMAL LOW
i-STAT O2 Saturation Venous: 82 %
i-STAT pCO2 Venous: 43.2 mmHg
i-STAT pH Venous: 7.333
i-STAT pO2 Venous: 49 mmHg

## 2022-09-19 LAB — WHOLE BLOOD GLUCOSE POCT: Whole Blood Glucose POCT: 456 mg/dL — ABNORMAL HIGH (ref 70–100)

## 2022-09-19 MED ORDER — SODIUM CHLORIDE 0.9 % IV BOLUS
1000.0000 mL | Freq: Once | INTRAVENOUS | Status: AC
Start: 2022-09-19 — End: 2022-09-20
  Administered 2022-09-19: 1000 mL via INTRAVENOUS

## 2022-09-19 NOTE — ED Triage Notes (Signed)
chest pain x3 days. pt also says his blood sugar is 500+. pt reports he has not taken any diabetic medications, pt is travelling. Marland Kitchen aox4 ambulatory.

## 2022-09-20 ENCOUNTER — Emergency Department: Payer: Self-pay

## 2022-09-20 LAB — URINALYSIS WITH REFLEX TO MICROSCOPIC EXAM IF INDICATED
Bilirubin, UA: NEGATIVE
Blood, UA: NEGATIVE
Leukocyte Esterase, UA: NEGATIVE
Nitrite, UA: NEGATIVE
Protein, UR: NEGATIVE
RBC, UA: 0 /hpf (ref 0–5)
Specific Gravity UA: 1.041 — AB (ref 1.001–1.035)
Urine pH: 6.5 (ref 5.0–8.0)
Urobilinogen, UA: NORMAL mg/dL (ref 0.2–2.0)

## 2022-09-20 LAB — COMPREHENSIVE METABOLIC PANEL
ALT: 13 U/L (ref 0–55)
AST (SGOT): 12 U/L (ref 5–41)
Albumin/Globulin Ratio: 0.8 — ABNORMAL LOW (ref 0.9–2.2)
Albumin: 3.6 g/dL (ref 3.5–5.0)
Alkaline Phosphatase: 93 U/L (ref 37–117)
Anion Gap: 11 (ref 5.0–15.0)
BUN: 11 mg/dL (ref 9.0–28.0)
Bilirubin, Total: 0.2 mg/dL (ref 0.2–1.2)
CO2: 24 mEq/L (ref 17–29)
Calcium: 9.7 mg/dL (ref 8.5–10.5)
Chloride: 99 mEq/L (ref 99–111)
Creatinine: 0.9 mg/dL (ref 0.5–1.5)
Globulin: 4.3 g/dL — ABNORMAL HIGH (ref 2.0–3.6)
Glucose: 496 mg/dL — ABNORMAL HIGH (ref 70–100)
Potassium: 4.1 mEq/L (ref 3.5–5.3)
Protein, Total: 7.9 g/dL (ref 6.0–8.3)
Sodium: 134 mEq/L — ABNORMAL LOW (ref 135–145)
eGFR: >60 mL/min/{1.73_m2} (ref >=60)

## 2022-09-20 LAB — ECG 12-LEAD
Atrial Rate: 81 {beats}/min
IHS MUSE NARRATIVE AND IMPRESSION: NORMAL
P Axis: 54 degrees
P-R Interval: 188 ms
Q-T Interval: 368 ms
QRS Duration: 112 ms
QTC Calculation (Bezet): 427 ms
R Axis: 4 degrees
T Axis: 25 degrees
Ventricular Rate: 81 {beats}/min

## 2022-09-20 LAB — D-DIMER: D-Dimer: 0.9 ug/mL FEU — ABNORMAL HIGH (ref 0.00–0.60)

## 2022-09-20 LAB — WHOLE BLOOD GLUCOSE POCT: Whole Blood Glucose POCT: 320 mg/dL — ABNORMAL HIGH (ref 70–100)

## 2022-09-20 LAB — LIPASE: Lipase: 25 U/L (ref 8–78)

## 2022-09-20 LAB — HIGH SENSITIVITY TROPONIN-I: hs Troponin-I: <2.7 ng/L

## 2022-09-20 MED ORDER — INSULIN LISPRO 100 UNIT/ML SOLN (WRAP)
5.0000 [IU] | Freq: Once | Status: AC
Start: 2022-09-20 — End: 2022-09-20
  Administered 2022-09-20: 5 [IU] via SUBCUTANEOUS
  Filled 2022-09-20: qty 15

## 2022-09-20 MED ORDER — SODIUM CHLORIDE 0.9 % IV BOLUS
1000.0000 mL | Freq: Once | INTRAVENOUS | Status: AC
Start: 2022-09-20 — End: 2022-09-20
  Administered 2022-09-20: 1000 mL via INTRAVENOUS

## 2022-09-20 MED ORDER — KETOROLAC TROMETHAMINE 30 MG/ML IJ SOLN
30.0000 mg | Freq: Once | INTRAMUSCULAR | Status: AC
Start: 2022-09-20 — End: 2022-09-20
  Administered 2022-09-20: 30 mg via INTRAVENOUS
  Filled 2022-09-20: qty 1

## 2022-09-20 MED ORDER — NAPROXEN 500 MG PO TABS
500.0000 mg | ORAL_TABLET | Freq: Two times a day (BID) | ORAL | 0 refills | Status: AC | PRN
Start: 2022-09-20 — End: ?

## 2022-09-20 MED ORDER — IOHEXOL 350 MG/ML IV SOLN
100.0000 mL | Freq: Once | INTRAVENOUS | Status: AC | PRN
Start: 2022-09-20 — End: 2022-09-20
  Administered 2022-09-20: 100 mL via INTRAVENOUS

## 2022-09-20 NOTE — ED Provider Notes (Signed)
EMERGENCY DEPARTMENT HISTORY AND PHYSICAL EXAM     None        Date: 09/19/2022  Patient Name: Harold Lucero    History of Presenting Illness     Chief Complaint   Patient presents with    Chest Pain    Hyperglycemia       History Provided By: Patient      Additional History: Harold Lucero is a 32 y.o. male presenting to the ED with a chief complaint of chest pain and elevated blood sugar.  Patient reports that he is visiting here for a funeral, drove here from [place].  3 days ago began to have moderate intensity left-sided chest pain.  No clear aggravating or alleviating factors, not worse with deep breaths, denies any shortness of breath.  Has a slight dry cough but reports that he vapes and this is chronic.  No history of lung disease.  Patient is a type II diabetic, on Ozempic, insulin glargine and insulin aspart.  Reports that he ran out of his medications, was finally able to get in touch with his physician who sent the medications to a [facility], patient will be able to pick them up tomorrow morning.  Reports that sugars have been running in the 500s, he has had increased urination but denies any abdominal pain, nausea or vomiting, no fevers.  Appetite has been normal.  3 days of left-sided chest pain, denies having symptoms like this before.  Not worse with exertion.  Denies prior diagnosis of hypertension, hyperlipidemia, coronary artery disease or prior MI.  No leg swelling or calf tenderness, no prior history of DVT or PE.    PCP: Pcp, None, MD  SPECIALISTS:    Current Facility-Administered Medications   Medication Dose Route Frequency Provider Last Rate Last Admin    ketorolac (TORADOL) injection 30 mg  30 mg Intravenous Once Luz Brazen, MD        sodium chloride 0.9 % bolus 1,000 mL  1,000 mL Intravenous Once Luz Brazen, MD 999 mL/hr at 09/20/22 0104 1,000 mL at 09/20/22 0104     Current Outpatient Medications   Medication Sig Dispense Refill    insulin aspart (NovoLOG) 100 UNIT/ML injection  Inject into the skin 3 (three) times daily before meals      insulin glargine 100 UNIT/ML injection Inject into the skin         Past History     Past Medical History:  Past Medical History:   Diagnosis Date    Diabetes mellitus        Past Surgical History:  History reviewed. No pertinent surgical history.    Family History:  History reviewed. No pertinent family history.    Social History:  Social History     Tobacco Use    Smoking status: Never    Smokeless tobacco: Never   Vaping Use    Vaping Use: Every day   Substance Use Topics    Alcohol use: Not Currently    Drug use: Never       Allergies:  Allergies   Allergen Reactions    Aspirin Hives       Review of Systems         Review of Systems   Constitutional:  Negative for chills and fever.   Eyes:  Negative for visual disturbance.   Respiratory:  Positive for cough. Negative for shortness of breath.    Cardiovascular:  Positive for chest pain.   Gastrointestinal:  Negative for  abdominal pain.   Genitourinary:  Positive for frequency.   Musculoskeletal:  Negative for back pain.   Neurological:  Negative for headaches.   Hematological:  Does not bruise/bleed easily.   Psychiatric/Behavioral:  Negative for confusion.          Physical Exam   BP 116/65   Pulse 82   Temp 97.7 F (36.5 C) (Oral)   Resp (!) 31   Ht 5\' 10"  (1.778 m)   Wt 113.9 kg   SpO2 99%   BMI 36.01 kg/m         Physical Exam  Vitals and nursing note reviewed.   Constitutional:       Appearance: He is well-developed.   HENT:      Head: Normocephalic and atraumatic.   Eyes:      Pupils: Pupils are equal, round, and reactive to light.   Cardiovascular:      Rate and Rhythm: Normal rate and regular rhythm.      Pulses:           Carotid pulses are 2+ on the right side and 2+ on the left side.       Radial pulses are 2+ on the right side and 2+ on the left side.      Heart sounds: Normal heart sounds. No murmur heard.     No friction rub. No gallop.   Pulmonary:      Effort: Pulmonary effort  is normal.      Breath sounds: Normal breath sounds. No decreased breath sounds, wheezing, rhonchi or rales.   Chest:      Chest wall: No tenderness.   Abdominal:      General: Bowel sounds are normal.      Palpations: Abdomen is soft. There is no mass.      Tenderness: There is no guarding or rebound.   Musculoskeletal:         General: Normal range of motion.      Cervical back: Normal range of motion and neck supple.      Right lower leg: No tenderness. No edema.      Left lower leg: No tenderness. No edema.   Skin:     General: Skin is warm and dry.      Capillary Refill: Capillary refill takes less than 2 seconds.   Neurological:      General: No focal deficit present.      Mental Status: He is alert.   Psychiatric:         Mood and Affect: Mood normal.         Behavior: Behavior normal.           Diagnostic Study Results     Labs -     Results       Procedure Component Value Units Date/Time    Glucose Whole Blood - POCT [id number]  (Abnormal) Collected: 09/20/22 0204     Updated: 09/20/22 0207     Whole Blood Glucose POCT 320 mg/dL     D-Dimer [id number]  (Abnormal) Collected: 09/20/22 0122     Updated: 09/20/22 0141     D-Dimer 0.90 ug/mL FEU     High Sensitivity Troponin-I at 0 hrs [id number] Collected: 09/19/22 2330    Specimen: Blood Updated: 09/20/22 0015     hs Troponin-I <2.7 ng/L     Urinalysis Reflex to Microscopic Exam [id number]  (Abnormal) Collected: 09/19/22 2338    Specimen: Urine Updated: 09/20/22 0013  Urine Type clean cat     Color, UA Colorless     Clarity, UA Clear     Specific Gravity UA 1.041     Urine pH 6.5     Leukocyte Esterase, UA Negative     Nitrite, UA Negative     Protein, UR Negative     Glucose, UA >1000= 4+     Ketones UA 20= 1+     Urobilinogen, UA Normal mg/dL      Bilirubin, UA Negative     Blood, UA Negative     RBC, UA 0 -2 /hpf      WBC, UA 0 - 5 /hpf     Comprehensive metabolic panel [[id number]]  (Abnormal) Collected: 09/19/22 2330    Specimen: Blood Updated:  09/20/22 0008     Glucose 496 mg/dL      BUN 11.0 mg/dL      Creatinine 0.9 mg/dL      Sodium 134 mEq/L      Potassium 4.1 mEq/L      Chloride 99 mEq/L      CO2 24 mEq/L      Calcium 9.7 mg/dL      Protein, Total 7.9 g/dL      Albumin 3.6 g/dL      AST (SGOT) 12 U/L      ALT 13 U/L      Alkaline Phosphatase 93 U/L      Bilirubin, Total 0.2 mg/dL      Globulin 4.3 g/dL      Albumin/Globulin Ratio 0.8     Anion Gap 11.0     eGFR >60.0 mL/min/1.73 m2     Lipase [[id number]] Collected: 09/19/22 2330    Specimen: Blood Updated: 09/20/22 0008     Lipase 25 U/L     i-Stat CG4 Venous CartrIDge [[id number]]  (Abnormal) Collected: 09/19/22 2330     Updated: 09/19/22 2357     i-STAT pH Venous 7.333     i-STAT pCO2 Venous 43.2 mmHg      i-STAT pO2 Venous 49.0 mmHg      i-STAT HCO3 Bicarbonate Venous 22.9 mEq/L      i-STAT O2 Saturation Venous 82.0 %     CBC and differential [[id number]]  (Abnormal) Collected: 09/19/22 2330    Specimen: Blood Updated: 09/19/22 2353     WBC 5.14 x10 3/uL      Hgb 14.2 g/dL      Hematocrit 38.5 %      Platelets 387 x10 3/uL      RBC 4.87 x10 6/uL      MCV 79.1 fL      MCH 29.2 pg      MCHC 36.9 g/dL      RDW 13 %      MPV 10.2 fL      Instrument Absolute Neutrophil Count 2.08 x10 3/uL      Neutrophils 40.5 %      Lymphocytes Automated 49.2 %      Monocytes 7.4 %      Eosinophils Automated 1.9 %      Basophils Automated 0.6 %      Immature Granulocytes 0.4 %      Nucleated RBC 0.0 /100 WBC      Neutrophils Absolute 2.08 x10 3/uL      Lymphocytes Absolute Automated 2.53 x10 3/uL      Monocytes Absolute Automated 0.38 x10 3/uL      Eosinophils Absolute Automated 0.10 x10 3/uL  Basophils Absolute Automated 0.03 x10 3/uL      Immature Granulocytes Absolute 0.02 x10 3/uL      Absolute NRBC 0.00 x10 3/uL     Glucose Whole Blood - POCT [id number]  (Abnormal) Collected: 09/19/22 2300     Updated: 09/19/22 2306     Whole Blood Glucose POCT 456 mg/dL             Radiologic Studies -   Radiology Results (24  Hour)       Procedure Component Value Units Date/Time    CT Angiogram Chest [id number] Collected: 09/20/22 0228    Order Status: Completed Updated: 09/20/22 0244    Narrative:      HISTORY: Left-sided chest pain    COMPARISON: None available.    TECHNIQUE: CTA chest WITH intravenous contrast. PE protocol. 3D MIP images  are submitted and reviewed. The following dose reduction techniques were  utilized: Automated exposure control and/or adjustment of the mA and/or kV  according to patient size, and the use of iterative reconstruction  technique.    CONTRAST: iohexol (OMNIPAQUE) 350 MG/ML injection 100 mL.     FINDINGS:   LUNGS and PLEURA: Within normal limits.  HEART: Within normal limits.  MEDIASTINUM:  Within normal limits.  PULMONARY ARTERIES: No pulmonary embolism within the limitations of motion  artifact which precludes evaluation of several segmental and subsegmental  pulmonary arteries.  UPPER ABDOMEN:  Within normal limits.  BONES:  Within normal limits.      Impression:        No acute abnormality.      Adolph Pollack Merchant  09/20/2022 2:42 AM    XR Chest 2 Views [id number] Collected: 09/20/22 0034    Order Status: Completed Updated: 09/20/22 0037    Narrative:      HISTORY:  Chest pain    COMPARISON: None    FINDINGS: The cardiac silhouette, mediastinum, and pulmonary vasculature  are normal. No consolidation or pleural effusion is identified. Hila are  unremarkable.      Impression:       Negative for acute process         Rosiland Oz, MD  09/20/2022 12:34 AM        .    Medical Decision Making   I am the first provider for this patient.    I reviewed the vital signs, available nursing notes, past medical history, past surgical history, family history and social history.    Vital Signs-Reviewed the patient's vital signs.   Patient Vitals for the past 12 hrs:   BP Temp Pulse Resp   09/20/22 0201 (!) 142/93 -- 73 20   09/19/22 2330 116/65 -- 82 (!) 31   09/19/22 2256 (!) 144/92 97.7 F (36.5 C) 85 18       Pulse  Oximetry Analysis - Normal 99% on RA    Cardiac Monitor:  Rate: 82  Rhythm:  Normal Sinus Rhythm     EKG:  Interpreted by the EP.   Time Interpreted: 2251   Rate: 81   Rhythm: Normal Sinus Rhythm    Interpretation: Heart rate 81, normal sinus rhythm, normal axis, incomplete right bundle branch block, otherwise normal intervals, diffuse J-point elevation consistent with early repolarization   Comparison: No prior study is available for comparison.    Old Medical Records: Nursing notes.     Procedure:     ED Course:     2:46AM-feeling better after Toradol, feels comfortable with discharge, plans to pick up his  insulin this morning    Counseled on diagnosis, f/u plans, medication use, home/self care, and signs and symptoms when to return to ED. Discussed diagnostic uncertainty and possibility of progression of illness. All questions from patient solicited and addressed. Pt expresses understanding, is stable, and amenable to discharge.         Provider Notes:     Medical Decision Making  Patient presents with 3 days of chest pain.  Heart score 2, no significant ST abnormalities on EKG, undetectable high-sensitivity troponin after 3 days of pain, very low suspicion for ACS.  Symptoms improved with Toradol.  Chest x-ray with no evidence of pleural effusion, pulmonary edema, pneumothorax.  Low risk for PE by Wells criteria with a positive D-dimer prompted CT angiogram which was negative for PE or any other cardiopulmonary process.  Patient significantly hyperglycemic, blood sugar 496, normal venous blood gas, normal bicarb on CMP, normal anion gap, very low suspicion for DKA.  Patient was given IV fluids and a small dose of subcutaneous insulin.  His blood sugar improved.  He has no nausea or vomiting, he feels comfortable drinking oral fluids, prescription for insulin has been sent to his pharmacy already, he will pick it up in the morning.  Feels comfortable with discharge, he will follow-up with his primary care  provider upon his return home.    Problems Addressed:  Chest pain, unspecified type: acute illness or injury with systemic symptoms  Hyperglycemia due to diabetes mellitus: chronic illness or injury with exacerbation, progression, or side effects of treatment    Amount and/or Complexity of Data Reviewed  External Data Reviewed: notes.     Details: 06/15/2009 outpatient office visit  Labs: ordered. Decision-making details documented in ED Course.  Radiology: ordered and independent interpretation performed. Decision-making details documented in ED Course.  ECG/medicine tests: ordered and independent interpretation performed. Decision-making details documented in ED Course.    Risk  OTC drugs.  Prescription drug management.          Clinical Decision Support:     Heart Score      Flowsheet Row Value   History 0   EKG 1   Risk Factors 1   Total (with age) 2   Onset of pain (time of START of last episode of chest pain)? >3 hrs ago              Diagnosis     Clinical Impression:   1. Chest pain, unspecified type    2. Hyperglycemia due to diabetes mellitus        Treatment Plan:   ED Disposition       ED Disposition   Discharge    Condition   --    Date/Time   Fri Sep 20, 2022  2:46 AM    Comment   Pharrell Rainwater discharge to home/self care.    Condition at disposition: Stable                   _______________________________      Dr. Arthor Captain, MD, is the primary provider for this patient.   _______________________________     Luz Brazen, MD  09/20/22 [phone]
